# Patient Record
Sex: Male | Born: 1965 | Race: Asian | Hispanic: No | Smoking: Current every day smoker
Health system: Southern US, Community
[De-identification: ages and names within clinical notes are randomized; demographics above are authoritative.]

---

## 2021-12-19 ENCOUNTER — Other Ambulatory Visit: Payer: Self-pay

## 2021-12-19 ENCOUNTER — Encounter (HOSPITAL_BASED_OUTPATIENT_CLINIC_OR_DEPARTMENT_OTHER): Payer: Self-pay

## 2021-12-19 ENCOUNTER — Emergency Department (HOSPITAL_BASED_OUTPATIENT_CLINIC_OR_DEPARTMENT_OTHER)
Admission: EM | Admit: 2021-12-19 | Discharge: 2021-12-19 | Disposition: A | Discharge status: Home / Self Care | Payer: BC Managed Care – PPO | Attending: Emergency Medicine | Admitting: Emergency Medicine

## 2021-12-19 ENCOUNTER — Emergency Department (HOSPITAL_BASED_OUTPATIENT_CLINIC_OR_DEPARTMENT_OTHER): Payer: BC Managed Care – PPO

## 2021-12-19 DIAGNOSIS — Z20822 Contact with and (suspected) exposure to covid-19: Secondary | ICD-10-CM | POA: Diagnosis not present

## 2021-12-19 DIAGNOSIS — I10 Essential (primary) hypertension: Secondary | ICD-10-CM | POA: Insufficient documentation

## 2021-12-19 DIAGNOSIS — I160 Hypertensive urgency: Secondary | ICD-10-CM | POA: Diagnosis not present

## 2021-12-19 DIAGNOSIS — J209 Acute bronchitis, unspecified: Secondary | ICD-10-CM | POA: Diagnosis not present

## 2021-12-19 DIAGNOSIS — R059 Cough, unspecified: Secondary | ICD-10-CM | POA: Diagnosis present

## 2021-12-19 DIAGNOSIS — Z79899 Other long term (current) drug therapy: Secondary | ICD-10-CM | POA: Diagnosis not present

## 2021-12-19 LAB — RESP PANEL BY RT-PCR (FLU A&B, COVID) ARPGX2
Influenza A by PCR: NEGATIVE
Influenza B by PCR: NEGATIVE
SARS Coronavirus 2 by RT PCR: NEGATIVE

## 2021-12-19 MED ORDER — AMLODIPINE BESYLATE 5 MG PO TABS
5.0000 mg | ORAL_TABLET | Freq: Once | ORAL | Status: AC
  Administered 2021-12-19: 5 mg via ORAL
  Filled 2021-12-19: qty 1

## 2021-12-19 MED ORDER — ALBUTEROL SULFATE HFA 108 (90 BASE) MCG/ACT IN AERS
2.0000 | INHALATION_SPRAY | Freq: Once | RESPIRATORY_TRACT | Status: AC
  Administered 2021-12-19: 2 via RESPIRATORY_TRACT
  Filled 2021-12-19: qty 6.7

## 2021-12-19 MED ORDER — AMLODIPINE BESYLATE 5 MG PO TABS: 5.0000 mg | ORAL_TABLET | Freq: Every day | ORAL | 1 refills | Status: DC

## 2021-12-19 MED ORDER — PREDNISONE 50 MG PO TABS
60.0000 mg | ORAL_TABLET | Freq: Once | ORAL | Status: AC
  Administered 2021-12-19: 60 mg via ORAL
  Filled 2021-12-19: qty 1

## 2021-12-19 MED ORDER — ALBUTEROL SULFATE (2.5 MG/3ML) 0.083% IN NEBU
2.5000 mg | INHALATION_SOLUTION | Freq: Once | RESPIRATORY_TRACT | Status: AC
  Administered 2021-12-19: 2.5 mg via RESPIRATORY_TRACT
  Filled 2021-12-19: qty 3

## 2021-12-19 MED ORDER — ALBUTEROL SULFATE HFA 108 (90 BASE) MCG/ACT IN AERS: 1.0000 | INHALATION_SPRAY | RESPIRATORY_TRACT | 0 refills | Status: DC | PRN

## 2021-12-19 MED ORDER — PREDNISONE 20 MG PO TABS: 40.0000 mg | ORAL_TABLET | Freq: Every day | ORAL | 0 refills | Status: AC

## 2021-12-19 MED ORDER — IPRATROPIUM-ALBUTEROL 0.5-2.5 (3) MG/3ML IN SOLN
3.0000 mL | Freq: Once | RESPIRATORY_TRACT | Status: AC
  Administered 2021-12-19: 3 mL via RESPIRATORY_TRACT
  Filled 2021-12-19: qty 3

## 2021-12-19 NOTE — ED Triage Notes (Signed)
Pt c/o flu like sx x 1 week-audible wheezing noted when entered triage ?

## 2021-12-19 NOTE — ED Notes (Signed)
Ambulated patient per order. Resting HR 103/ Oxygen saturation 92%. Patient maintained a normal gait without difficulty. Patient oxygen saturation maintained at 92%. /HR 106. Patient returned to room and placed back on  monitor . Patient tolerated well. ?

## 2021-12-19 NOTE — Discharge Instructions (Addendum)
If you develop new or worsening shortness of breath, fever, cough, chest pain, or any other new/concerning symptoms then return to the ER for evaluation. ? ?Use the albuterol inhaler 1-2 puffs every 4 hours as needed for cough or shortness of breath.  If you needed significantly more than this then return to the ER for evaluation. ?

## 2021-12-19 NOTE — ED Notes (Signed)
ED Provider at bedside. 

## 2021-12-19 NOTE — ED Provider Notes (Signed)
?[facility] EMERGENCY DEPARTMENT ?Provider Note ? ? ?CSN: [id number] ?Arrival date & time: 12/19/21  1922 ? ?  ? ?History ? ?Chief Complaint  ?Patient presents with  ? Cough  ? ? ?Benjamin Decker is a 56 y.o. male. ? ?HPI ?56 year old male with no significant past medical history presents with cough and shortness of breath.  Started about 2 days ago.  No fevers.  He has a little bit of chest tightness but no chest pain.  No leg swelling.  He smokes about 2 or 3 cigarettes/day.  Denies any history of asthma or bronchitis. ? ?Home Medications ?Prior to Admission medications   ?Medication Sig Start Date End Date Taking? Authorizing Provider  ?albuterol (VENTOLIN HFA) 108 (90 Base) MCG/ACT inhaler Inhale 1-2 puffs into the lungs every 4 (four) hours as needed for wheezing or shortness of breath. 12/19/21  Yes Pricilla Loveless, MD  ?amLODipine (NORVASC) 5 MG tablet Take 1 tablet (5 mg total) by mouth daily. 12/19/21  Yes Pricilla Loveless, MD  ?predniSONE (DELTASONE) 20 MG tablet Take 2 tablets (40 mg total) by mouth daily for 4 days. 12/20/21 12/24/21 Yes Pricilla Loveless, MD  ?   ? ?Allergies    ?Patient has no known allergies.   ? ?Review of Systems   ?Review of Systems  ?Constitutional:  Negative for fever.  ?Respiratory:  Positive for cough, chest tightness and shortness of breath.   ?Cardiovascular:  Negative for chest pain and leg swelling.  ? ?Physical Exam ?Updated Vital Signs ?BP (!) 164/101 (BP Location: Left Arm)   Pulse (!) 104   Temp 98.4 ?F (36.9 ?C) (Oral)   Resp (!) 22   Ht 5\' 3"  (1.6 m)   Wt 66.2 kg   SpO2 94%   BMI 25.86 kg/m?  ?Physical Exam ?Vitals and nursing note reviewed.  ?Constitutional:   ?   Appearance: He is well-developed. He is not ill-appearing or diaphoretic.  ?HENT:  ?   Head: Normocephalic and atraumatic.  ?Cardiovascular:  ?   Rate and Rhythm: Normal rate and regular rhythm.  ?   Heart sounds: Normal heart sounds.  ?Pulmonary:  ?   Effort: Pulmonary effort is normal.  ?   Breath  sounds: Wheezing (diffuse, expiratory) present.  ?Abdominal:  ?   General: There is no distension.  ?Musculoskeletal:  ?   Right lower leg: No edema.  ?   Left lower leg: No edema.  ?Skin: ?   General: Skin is warm and dry.  ?Neurological:  ?   Mental Status: He is alert.  ? ? ?ED Results / Procedures / Treatments   ?Labs ?(all labs ordered are listed, but only abnormal results are displayed) ?Labs Reviewed  ?RESP PANEL BY RT-PCR (FLU A&B, COVID) ARPGX2  ? ? ?EKG ?EKG Interpretation ? ?Date/Time:  Monday December 19 2021 19:51:40 EDT ?Ventricular Rate:  69 ?PR Interval:  161 ?QRS Duration: 95 ?QT Interval:  404 ?QTC Calculation: 433 ?R Axis:   23 ?Text Interpretation: Sinus rhythm no acute ST/T changes No old tracing to compare Confirmed by [id number] [phone]) on 12/19/2021 8:03:51 PM ? ?Radiology ?DG Chest Portable 1 View ? ?Result Date: 12/19/2021 ?CLINICAL DATA:  Cough and wheezing for 1 week, initial encounter EXAM: PORTABLE CHEST 1 VIEW COMPARISON:  None. FINDINGS: The heart size and mediastinal contours are within normal limits. Both lungs are clear. The visualized skeletal structures are unremarkable. IMPRESSION: No active disease. Electronically Signed   By: 12/21/2021 M.D.   On: 12/19/2021  20:33   ? ?Procedures ?Procedures  ? ? ?Medications Ordered in ED ?Medications  ?ipratropium-albuterol (DUONEB) 0.5-2.5 (3) MG/3ML nebulizer solution 3 mL (3 mLs Nebulization Given 12/19/21 1952)  ?albuterol (PROVENTIL) (2.5 MG/3ML) 0.083% nebulizer solution 2.5 mg (2.5 mg Nebulization Given 12/19/21 1952)  ?predniSONE (DELTASONE) tablet 60 mg (60 mg Oral Given 12/19/21 2110)  ?amLODipine (NORVASC) tablet 5 mg (5 mg Oral Given 12/19/21 2110)  ?albuterol (PROVENTIL) (2.5 MG/3ML) 0.083% nebulizer solution 2.5 mg (2.5 mg Nebulization Given 12/19/21 2058)  ?albuterol (VENTOLIN HFA) 108 (90 Base) MCG/ACT inhaler 2 puff (2 puffs Inhalation Given 12/19/21 2200)  ? ? ?ED Course/ Medical Decision Making/ A&P ?  ?                         ?Medical Decision Making ?Problems Addressed: ?Acute bronchitis, unspecified organism: acute illness or injury that poses a threat to life or bodily functions ? ?Amount and/or Complexity of Data Reviewed ?External Data Reviewed: notes. ?Radiology: ordered and independent interpretation performed. ? ?Risk ?Prescription drug management. ? ? ?Patient appears to have acute bronchitis.  He is feeling better after his second round of albuterol/Atrovent.  He does not carry a COPD or asthma diagnosis, but he also was seen at an outside urgent care for wheezing a few months ago according to chart review.  He is not hypoxic.  He is feeling better and able to ambulate without desaturation.  He is also quite hypertensive and states he has not been on his antihypertensives for the last 3 months.  He is not sure what he was on so I gave him some Norvasc and will prescribe this.  Given concern he might have undiagnosed COPD in the setting of bronchitis, will give a steroid burst in addition to albuterol.  I do not think antibiotics are warranted given clear x-ray.  I personally reviewed/interpreted this image and there is no obvious pneumonia.  Will discharge home with return precautions. ? ? ? ? ? ? ? ?Final Clinical Impression(s) / ED Diagnoses ?Final diagnoses:  ?Acute bronchitis, unspecified organism  ?Hypertensive urgency  ? ? ?Rx / DC Orders ?ED Discharge Orders   ? ?      Ordered  ?  predniSONE (DELTASONE) 20 MG tablet  Daily       ? 12/19/21 2156  ?  albuterol (VENTOLIN HFA) 108 (90 Base) MCG/ACT inhaler  Every 4 hours PRN       ? 12/19/21 2156  ?  amLODipine (NORVASC) 5 MG tablet  Daily       ? 12/19/21 2156  ? ?  ?  ? ?  ? ? ?  ?Pricilla Loveless, MD ?12/19/21 2325 ? ?

## 2022-02-27 ENCOUNTER — Other Ambulatory Visit: Payer: Self-pay

## 2022-02-27 ENCOUNTER — Encounter (HOSPITAL_BASED_OUTPATIENT_CLINIC_OR_DEPARTMENT_OTHER): Payer: Self-pay | Admitting: Pediatrics

## 2022-02-27 ENCOUNTER — Emergency Department (HOSPITAL_BASED_OUTPATIENT_CLINIC_OR_DEPARTMENT_OTHER)
Admission: EM | Admit: 2022-02-27 | Discharge: 2022-02-27 | Disposition: A | Discharge status: Home / Self Care | Payer: BC Managed Care – PPO | Attending: Emergency Medicine | Admitting: Emergency Medicine

## 2022-02-27 ENCOUNTER — Emergency Department (HOSPITAL_BASED_OUTPATIENT_CLINIC_OR_DEPARTMENT_OTHER): Payer: BC Managed Care – PPO

## 2022-02-27 DIAGNOSIS — D72829 Elevated white blood cell count, unspecified: Secondary | ICD-10-CM | POA: Insufficient documentation

## 2022-02-27 DIAGNOSIS — Z79899 Other long term (current) drug therapy: Secondary | ICD-10-CM | POA: Insufficient documentation

## 2022-02-27 DIAGNOSIS — I10 Essential (primary) hypertension: Secondary | ICD-10-CM | POA: Diagnosis not present

## 2022-02-27 DIAGNOSIS — Z20822 Contact with and (suspected) exposure to covid-19: Secondary | ICD-10-CM | POA: Diagnosis not present

## 2022-02-27 DIAGNOSIS — R062 Wheezing: Secondary | ICD-10-CM | POA: Insufficient documentation

## 2022-02-27 LAB — CBC WITH DIFFERENTIAL/PLATELET
Abs Immature Granulocytes: 0.04 10*3/uL (ref 0.00–0.07)
Basophils Absolute: 0.1 10*3/uL (ref 0.0–0.1)
Basophils Relative: 1 %
Eosinophils Absolute: 0.5 10*3/uL (ref 0.0–0.5)
Eosinophils Relative: 4 %
HCT: 47.2 % (ref 39.0–52.0)
Hemoglobin: 16.1 g/dL (ref 13.0–17.0)
Immature Granulocytes: 0 %
Lymphocytes Relative: 12 %
Lymphs Abs: 1.5 10*3/uL (ref 0.7–4.0)
MCH: 31 pg (ref 26.0–34.0)
MCHC: 34.1 g/dL (ref 30.0–36.0)
MCV: 90.9 fL (ref 80.0–100.0)
Monocytes Absolute: 0.9 10*3/uL (ref 0.1–1.0)
Monocytes Relative: 7 %
Neutro Abs: 9.8 10*3/uL — ABNORMAL HIGH (ref 1.7–7.7)
Neutrophils Relative %: 76 %
Platelets: 206 10*3/uL (ref 150–400)
RBC: 5.19 MIL/uL (ref 4.22–5.81)
RDW: 12.9 % (ref 11.5–15.5)
WBC: 12.9 10*3/uL — ABNORMAL HIGH (ref 4.0–10.5)
nRBC: 0 % (ref 0.0–0.2)

## 2022-02-27 LAB — BASIC METABOLIC PANEL
Anion gap: 9 (ref 5–15)
BUN: 16 mg/dL (ref 6–20)
CO2: 24 mmol/L (ref 22–32)
Calcium: 9.1 mg/dL (ref 8.9–10.3)
Chloride: 105 mmol/L (ref 98–111)
Creatinine, Ser: 1.22 mg/dL (ref 0.61–1.24)
GFR, Estimated: >60 mL/min (ref >60)
Glucose, Bld: 95 mg/dL (ref 70–99)
Potassium: 4.2 mmol/L (ref 3.5–5.1)
Sodium: 138 mmol/L (ref 135–145)

## 2022-02-27 LAB — TROPONIN I (HIGH SENSITIVITY): Troponin I (High Sensitivity): 5 ng/L (ref <18)

## 2022-02-27 LAB — RESP PANEL BY RT-PCR (FLU A&B, COVID) ARPGX2
Influenza A by PCR: NEGATIVE
Influenza B by PCR: NEGATIVE
SARS Coronavirus 2 by RT PCR: NEGATIVE

## 2022-02-27 MED ORDER — AMLODIPINE BESYLATE 5 MG PO TABS: 5.0000 mg | ORAL_TABLET | Freq: Every day | ORAL | 1 refills | Status: AC

## 2022-02-27 MED ORDER — ALBUTEROL SULFATE HFA 108 (90 BASE) MCG/ACT IN AERS: 2.0000 | INHALATION_SPRAY | RESPIRATORY_TRACT | Status: DC | PRN

## 2022-02-27 MED ORDER — IPRATROPIUM-ALBUTEROL 0.5-2.5 (3) MG/3ML IN SOLN
RESPIRATORY_TRACT | Status: AC
  Administered 2022-02-27: 3 mL
  Filled 2022-02-27: qty 3

## 2022-02-27 MED ORDER — HYDRALAZINE HCL 20 MG/ML IJ SOLN
25.0000 mg | Freq: Once | INTRAMUSCULAR | Status: AC
  Administered 2022-02-27: 25 mg via INTRAVENOUS
  Filled 2022-02-27: qty 2

## 2022-02-27 MED ORDER — ALBUTEROL (5 MG/ML) CONTINUOUS INHALATION SOLN
INHALATION_SOLUTION | RESPIRATORY_TRACT | Status: AC
  Administered 2022-02-27: 2.5 mg
  Filled 2022-02-27: qty 0.5

## 2022-02-27 MED ORDER — ALBUTEROL SULFATE HFA 108 (90 BASE) MCG/ACT IN AERS: 1.0000 | INHALATION_SPRAY | RESPIRATORY_TRACT | 2 refills | Status: DC | PRN

## 2022-02-27 MED ORDER — METHYLPREDNISOLONE SODIUM SUCC 125 MG IJ SOLR
125.0000 mg | Freq: Once | INTRAMUSCULAR | Status: AC
  Administered 2022-02-27: 125 mg via INTRAVENOUS
  Filled 2022-02-27: qty 2

## 2022-02-27 NOTE — ED Provider Notes (Signed)
[facility] EMERGENCY DEPARTMENT Provider Note   CSN: [id number] Arrival date & time: 02/27/22  1726     History  Chief Complaint  Patient presents with   Wheezing    Benjamin Decker is a 56 y.o. male with past medical history significant for hypertension, previous evaluation for acute bronchitis who presents with difficulty breathing without chest pain from last night.  Patient denies any previous history of asthma, COPD.  Denies any sick contacts, fever, chills.  He reports that his blood pressure has been under poor control since he ran out of the amlodipine medication that he was started on last time he was in the emergency department.  He denies any previous history of ACS, chest pain, heart palpitations, nausea, vomiting, dysuria, abdominal pain.  He reports his only significant symptom at this time is difficulty breathing.  No previous history of heart failure.   Wheezing     Home Medications Prior to Admission medications   Medication Sig Start Date End Date Taking? Authorizing Provider  albuterol (VENTOLIN HFA) 108 (90 Base) MCG/ACT inhaler Inhale 1-2 puffs into the lungs every 4 (four) hours as needed for wheezing or shortness of breath. 02/27/22   Joclyn Alsobrook H, PA-C  amLODipine (NORVASC) 5 MG tablet Take 1 tablet (5 mg total) by mouth daily. 02/27/22   Gianfranco Araki H, PA-C      Allergies    Patient has no known allergies.    Review of Systems   Review of Systems  Respiratory:  Positive for wheezing.   All other systems reviewed and are negative.  Physical Exam Updated Vital Signs BP (!) 156/120   Pulse (!) 102   Temp 98.2 F (36.8 C)   Resp 20   Ht 5\' 3"  (1.6 m)   Wt 66.7 kg   SpO2 93%   BMI 26.04 kg/m  Physical Exam Vitals and nursing note reviewed.  Constitutional:      General: He is not in acute distress.    Appearance: Normal appearance.  HENT:     Head: Normocephalic and atraumatic.  Eyes:     General:        Right eye:  No discharge.        Left eye: No discharge.  Cardiovascular:     Rate and Rhythm: Normal rate and regular rhythm.     Heart sounds: No murmur heard.   No friction rub. No gallop.  Pulmonary:     Effort: Pulmonary effort is normal.     Breath sounds: Normal breath sounds.     Comments: Patient with mild tachypnea.  He does have biphasic wheezing throughout both lung fields even after albuterol treatment x1.  He is not in acute respiratory distress however at this time.  No accessory muscle use, intercostal retractions. Abdominal:     General: Bowel sounds are normal.     Palpations: Abdomen is soft.  Skin:    General: Skin is warm and dry.     Capillary Refill: Capillary refill takes less than 2 seconds.     Comments: Patient has some rash, excoriations on the abdomen without signs of secondary infection  Neurological:     Mental Status: He is alert and oriented to person, place, and time.  Psychiatric:        Mood and Affect: Mood normal.        Behavior: Behavior normal.    ED Results / Procedures / Treatments   Labs (all labs ordered are listed, but only  abnormal results are displayed) Labs Reviewed  CBC WITH DIFFERENTIAL/PLATELET - Abnormal; Notable for the following components:      Result Value   WBC 12.9 (*)    Neutro Abs 9.8 (*)    All other components within normal limits  RESP PANEL BY RT-PCR (FLU A&B, COVID) ARPGX2  BASIC METABOLIC PANEL  TROPONIN I (HIGH SENSITIVITY)    EKG EKG Interpretation  Date/Time:  Monday February 27 2022 17:42:28 EDT Ventricular Rate:  69 PR Interval:  158 QRS Duration: 97 QT Interval:  419 QTC Calculation: 449 R Axis:   14 Text Interpretation: Sinus rhythm Probable left atrial enlargement Abnormal inferior Q waves Anterior infarct, old Confirmed by Alona Bene [phone]) on 02/27/2022 5:44:39 PM  Radiology DG Chest 2 View  Result Date: 02/27/2022 CLINICAL DATA:  Shortness of breath EXAM: CHEST - 2 VIEW COMPARISON:  12/19/2021 FINDINGS:  Transverse diameter of heart is increased. Thoracic aorta is tortuous and ectatic. Lung fields are clear of any infiltrates or pulmonary edema. There is no pleural effusion or pneumothorax. IMPRESSION: Cardiomegaly. There are no signs of pulmonary edema or focal pulmonary consolidation. Electronically Signed   By: Ernie Avena M.D.   On: 02/27/2022 18:19    Procedures Procedures    Medications Ordered in ED Medications  albuterol (VENTOLIN HFA) 108 (90 Base) MCG/ACT inhaler 2 puff (has no administration in time range)  ipratropium-albuterol (DUONEB) 0.5-2.5 (3) MG/3ML nebulizer solution (3 mLs  Given 02/27/22 1740)  albuterol (VENTOLIN) (5 MG/ML) 0.5% continuous inhalation solution (2.5 mg  Given 02/27/22 1740)  methylPREDNISolone sodium succinate (SOLU-MEDROL) 125 mg/2 mL injection 125 mg (125 mg Intravenous Given 02/27/22 1756)  hydrALAZINE (APRESOLINE) injection 25 mg (25 mg Intravenous Given 02/27/22 1756)    ED Course/ Medical Decision Making/ A&P                           Medical Decision Making Amount and/or Complexity of Data Reviewed Labs: ordered. Radiology: ordered.  Risk Prescription drug management.   This patient is a 56 y.o. male who presents to the ED for concern of shortness of breath, wheezing, uncontrolled hypertension, this involves an extensive number of treatment options, and is a complaint that carries with it a high risk of complications and morbidity. The emergent differential diagnosis prior to evaluation includes, but is not limited to,  asthma exacerbation, COPD exacerbation, heart failure exacerbation, pneumonia, acute bronchitis, or other respiratory illness versus PE versus other.   This is not an exhaustive differential.   Past Medical History / Co-morbidities / Social History: Patient without PCP care or noted diagnoses, he does have a history of hypertension on previous evaluation  Additional history: Chart reviewed. Pertinent results include:  Reviewed lab work, imaging, and course from recent evaluation for acute bronchitis, hypertension in March  Physical Exam: Physical exam performed. The pertinent findings include: Patient with diffuse biphasic wheezing without respiratory distress.  Lab Tests: I ordered, and personally interpreted labs.  The pertinent results include: Negative troponin x1 in context of no chest pain. Troponin ordered due to hypertension with systolic over 200 for possible hypertensive emergency.  RVP negative for COVID, flu.  BMP unremarkable.  CBC with a mild leukocytosis white blood cells of 12.9.     Imaging Studies: I ordered imaging studies including cxr. I independently visualized and interpreted imaging which showed Cardiomegaly without signs of pulmonary congestion, or focal consolidation. I agree with the radiologist interpretation.   Cardiac Monitoring:  The  patient was maintained on a cardiac monitor.  My attending physician Dr. Jacqulyn BathLong viewed and interpreted the cardiac monitored which showed an underlying rhythm of: Normal sinus rhythm,  evidence of left atrial enlargement, evidence of possible ischemic activity in the past but no acute ischemic changes today. I agree with this interpretation.   Medications: I ordered medication including rousing for blood pressure, Solu-Medrol for Reactive airway disease, albuterol/DuoNeb for, respiratory distress without hypoxia. Reevaluation of the patient after these medicines showed that the patient resolved. I have reviewed the patients home medicines and have made adjustments as needed.  Patient with significant improvement of blood pressure as well as complete resolution of wheezing prior to discharge.  We will  refill his hypertension medication as well as inhaler.  Discussed the importance of PCP follow-up to get on a long-term prescription of these medications.   Disposition: After consideration of the diagnostic results and the patients response to treatment, I  feel that it appears stable for discharge at this time.  Extensive return precautions given  I discussed this case with my attending physician Dr. Jacqulyn BathLong who cosigned this note including patient's presenting symptoms, physical exam, and planned diagnostics and interventions. Attending physician stated agreement with plan or made changes to plan which were implemented.    Final Clinical Impression(s) / ED Diagnoses Final diagnoses:  Wheezing  Hypertension, unspecified type    Rx / DC Orders ED Discharge Orders          Ordered    albuterol (VENTOLIN HFA) 108 (90 Base) MCG/ACT inhaler  Every 4 hours PRN        02/27/22 2014    amLODipine (NORVASC) 5 MG tablet  Daily        02/27/22 2014              West Balirosperi, Alani Lacivita H, PA-C 02/27/22 2018    Maia PlanLong, Joshua G, MD 02/28/22 1323

## 2022-02-27 NOTE — ED Triage Notes (Signed)
C/O difficulty breathing since last night

## 2022-02-27 NOTE — Discharge Instructions (Addendum)
Your symptoms today are consistent with a reactive airway disease/COPD or asthma exacerbation, however you do not have a previous known diagnosis of this.  Your breathing is significantly improved on reevaluation.  I am represcribing your blood pressure medicine as well as your inhaler.  It is crucial that you follow-up with your primary care doctor so that you can get on blood pressure medicine and prevent damage to your heart.  Please return to the emergency department if you begin to have worsening shortness of breath, chest pain, fever, chills or other concerns.

## 2022-04-09 ENCOUNTER — Encounter (HOSPITAL_BASED_OUTPATIENT_CLINIC_OR_DEPARTMENT_OTHER): Payer: Self-pay | Admitting: Emergency Medicine

## 2022-04-09 ENCOUNTER — Emergency Department (HOSPITAL_BASED_OUTPATIENT_CLINIC_OR_DEPARTMENT_OTHER): Payer: BC Managed Care – PPO

## 2022-04-09 ENCOUNTER — Emergency Department (HOSPITAL_BASED_OUTPATIENT_CLINIC_OR_DEPARTMENT_OTHER)
Admission: EM | Admit: 2022-04-09 | Discharge: 2022-04-10 | Disposition: A | Discharge status: Home / Self Care | Payer: BC Managed Care – PPO | Attending: Emergency Medicine | Admitting: Emergency Medicine

## 2022-04-09 DIAGNOSIS — F17200 Nicotine dependence, unspecified, uncomplicated: Secondary | ICD-10-CM | POA: Diagnosis not present

## 2022-04-09 DIAGNOSIS — Z79899 Other long term (current) drug therapy: Secondary | ICD-10-CM | POA: Insufficient documentation

## 2022-04-09 DIAGNOSIS — J441 Chronic obstructive pulmonary disease with (acute) exacerbation: Secondary | ICD-10-CM | POA: Diagnosis not present

## 2022-04-09 DIAGNOSIS — R0602 Shortness of breath: Secondary | ICD-10-CM | POA: Diagnosis present

## 2022-04-09 DIAGNOSIS — I10 Essential (primary) hypertension: Secondary | ICD-10-CM | POA: Diagnosis not present

## 2022-04-09 LAB — CBC WITH DIFFERENTIAL/PLATELET
Abs Immature Granulocytes: 0.02 10*3/uL (ref 0.00–0.07)
Basophils Absolute: 0.1 10*3/uL (ref 0.0–0.1)
Basophils Relative: 1 %
Eosinophils Absolute: 0.7 10*3/uL — ABNORMAL HIGH (ref 0.0–0.5)
Eosinophils Relative: 8 %
HCT: 45.7 % (ref 39.0–52.0)
Hemoglobin: 15.6 g/dL (ref 13.0–17.0)
Immature Granulocytes: 0 %
Lymphocytes Relative: 27 %
Lymphs Abs: 2.3 10*3/uL (ref 0.7–4.0)
MCH: 31 pg (ref 26.0–34.0)
MCHC: 34.1 g/dL (ref 30.0–36.0)
MCV: 90.9 fL (ref 80.0–100.0)
Monocytes Absolute: 0.7 10*3/uL (ref 0.1–1.0)
Monocytes Relative: 8 %
Neutro Abs: 4.9 10*3/uL (ref 1.7–7.7)
Neutrophils Relative %: 56 %
Platelets: 198 10*3/uL (ref 150–400)
RBC: 5.03 MIL/uL (ref 4.22–5.81)
RDW: 12.6 % (ref 11.5–15.5)
WBC: 8.7 10*3/uL (ref 4.0–10.5)
nRBC: 0 % (ref 0.0–0.2)

## 2022-04-09 LAB — BASIC METABOLIC PANEL
Anion gap: 7 (ref 5–15)
BUN: 27 mg/dL — ABNORMAL HIGH (ref 6–20)
CO2: 29 mmol/L (ref 22–32)
Calcium: 9.6 mg/dL (ref 8.9–10.3)
Chloride: 104 mmol/L (ref 98–111)
Creatinine, Ser: 1.32 mg/dL — ABNORMAL HIGH (ref 0.61–1.24)
GFR, Estimated: >60 mL/min (ref >60)
Glucose, Bld: 111 mg/dL — ABNORMAL HIGH (ref 70–99)
Potassium: 3.7 mmol/L (ref 3.5–5.1)
Sodium: 140 mmol/L (ref 135–145)

## 2022-04-09 LAB — BRAIN NATRIURETIC PEPTIDE: B Natriuretic Peptide: 21.1 pg/mL (ref 0.0–100.0)

## 2022-04-09 MED ORDER — ALBUTEROL SULFATE (2.5 MG/3ML) 0.083% IN NEBU
5.0000 mg | INHALATION_SOLUTION | Freq: Once | RESPIRATORY_TRACT | Status: AC
  Administered 2022-04-09: 5 mg via RESPIRATORY_TRACT
  Filled 2022-04-09: qty 6

## 2022-04-09 MED ORDER — PREDNISONE 10 MG PO TABS: 40.0000 mg | ORAL_TABLET | Freq: Every day | ORAL | 0 refills | Status: DC

## 2022-04-09 MED ORDER — METHYLPREDNISOLONE SODIUM SUCC 125 MG IJ SOLR
125.0000 mg | Freq: Once | INTRAMUSCULAR | Status: AC
  Administered 2022-04-09: 125 mg via INTRAVENOUS
  Filled 2022-04-09: qty 2

## 2022-04-09 MED ORDER — ALBUTEROL SULFATE (2.5 MG/3ML) 0.083% IN NEBU
2.5000 mg | INHALATION_SOLUTION | Freq: Once | RESPIRATORY_TRACT | Status: AC
  Administered 2022-04-09: 2.5 mg via RESPIRATORY_TRACT
  Filled 2022-04-09: qty 3

## 2022-04-09 MED ORDER — AMLODIPINE BESYLATE 5 MG PO TABS: 5.0000 mg | ORAL_TABLET | Freq: Every day | ORAL | 3 refills | Status: DC

## 2022-04-09 MED ORDER — ALBUTEROL SULFATE HFA 108 (90 BASE) MCG/ACT IN AERS
2.0000 | INHALATION_SPRAY | Freq: Four times a day (QID) | RESPIRATORY_TRACT | Status: DC | PRN
  Administered 2022-04-09: 2 via RESPIRATORY_TRACT
  Filled 2022-04-09: qty 6.7

## 2022-04-09 MED ORDER — IPRATROPIUM-ALBUTEROL 0.5-2.5 (3) MG/3ML IN SOLN
3.0000 mL | Freq: Once | RESPIRATORY_TRACT | Status: AC
  Administered 2022-04-09: 3 mL via RESPIRATORY_TRACT
  Filled 2022-04-09: qty 3

## 2022-04-09 NOTE — ED Provider Notes (Addendum)
[facility] EMERGENCY DEPARTMENT Provider Note   CSN: [id number] Arrival date & time: 04/09/22  2042     History  Chief Complaint  Patient presents with   Shortness of Breath    Benjamin Decker is a 56 y.o. male.  Patient presenting with significant wheezing and trouble breathing.  Oxygen sats were 85% on room air in triage.  Patient started on oxygen.  Patient given DuoNebs and albuterol nebulizer treatments and Solu-Medrol IV.  Patient denies past medical history.  The patient was seen in March of this year and June 5 of this year at that time was treated with albuterol and Norvasc for his blood pressure.  Known to have a history of hypertension.  And most likely does have COPD because he still smoker.  But they have called him asthma or reactive airway disease.  Patient does not have a primary care doctor.       Home Medications Prior to Admission medications   Medication Sig Start Date End Date Taking? Authorizing Provider  amLODipine (NORVASC) 5 MG tablet Take 1 tablet (5 mg total) by mouth daily. 04/09/22  Yes Vanetta Mulders, MD  predniSONE (DELTASONE) 10 MG tablet Take 4 tablets (40 mg total) by mouth daily. 04/09/22  Yes Vanetta Mulders, MD  albuterol (VENTOLIN HFA) 108 (90 Base) MCG/ACT inhaler Inhale 1-2 puffs into the lungs every 4 (four) hours as needed for wheezing or shortness of breath. 02/27/22   Prosperi, Christian H, PA-C  amLODipine (NORVASC) 5 MG tablet Take 1 tablet (5 mg total) by mouth daily. 02/27/22   Prosperi, Christian H, PA-C      Allergies    Patient has no known allergies.    Review of Systems   Review of Systems  Constitutional:  Negative for chills and fever.  HENT:  Negative for ear pain and sore throat.   Eyes:  Negative for pain and visual disturbance.  Respiratory:  Positive for shortness of breath and wheezing. Negative for cough.   Cardiovascular:  Negative for chest pain, palpitations and leg swelling.  Gastrointestinal:   Negative for abdominal pain and vomiting.  Genitourinary:  Negative for dysuria and hematuria.  Musculoskeletal:  Negative for arthralgias and back pain.  Skin:  Negative for color change and rash.  Neurological:  Negative for seizures and syncope.  All other systems reviewed and are negative.   Physical Exam Updated Vital Signs BP (!) 157/105   Pulse (!) 106   Temp 98.1 F (36.7 C) (Oral)   Resp (!) 22   Ht 1.6 m (5\' 3" )   Wt 68.9 kg   SpO2 97%   BMI 26.93 kg/m  Physical Exam Vitals and nursing note reviewed.  Constitutional:      General: He is not in acute distress.    Appearance: Normal appearance. He is well-developed.  HENT:     Head: Normocephalic and atraumatic.  Eyes:     Conjunctiva/sclera: Conjunctivae normal.     Pupils: Pupils are equal, round, and reactive to light.  Cardiovascular:     Rate and Rhythm: Normal rate and regular rhythm.     Heart sounds: No murmur heard. Pulmonary:     Effort: Respiratory distress present.     Breath sounds: Wheezing present.  Abdominal:     Palpations: Abdomen is soft.     Tenderness: There is no abdominal tenderness.  Musculoskeletal:        General: No swelling.     Cervical back: Neck supple.  Right lower leg: No edema.     Left lower leg: No edema.  Skin:    General: Skin is warm and dry.     Capillary Refill: Capillary refill takes less than 2 seconds.  Neurological:     General: No focal deficit present.     Mental Status: He is alert and oriented to person, place, and time.     Cranial Nerves: No cranial nerve deficit.     Sensory: No sensory deficit.  Psychiatric:        Mood and Affect: Mood normal.     ED Results / Procedures / Treatments   Labs (all labs ordered are listed, but only abnormal results are displayed) Labs Reviewed  BASIC METABOLIC PANEL - Abnormal; Notable for the following components:      Result Value   Glucose, Bld 111 (*)    BUN 27 (*)    Creatinine, Ser 1.32 (*)    All  other components within normal limits  CBC WITH DIFFERENTIAL/PLATELET  BRAIN NATRIURETIC PEPTIDE    EKG EKG Interpretation  Date/Time:  Sunday April 09 2022 21:28:20 EDT Ventricular Rate:  83 PR Interval:  158 QRS Duration: 92 QT Interval:  376 QTC Calculation: 441 R Axis:   -7 Text Interpretation: Normal sinus rhythm Inferior infarct , age undetermined Anterior infarct , age undetermined Abnormal ECG When compared with ECG of 27-Feb-2022 17:42, PREVIOUS ECG IS PRESENT No significant change since last tracing Confirmed by Vanetta Mulders [phone]) on 04/09/2022 10:35:21 PM  Radiology DG Chest 2 View  Result Date: 04/09/2022 CLINICAL DATA:  Shortness of breath and cough. EXAM: CHEST - 2 VIEW COMPARISON:  Chest radiograph dated 02/27/2022. FINDINGS: No focal consolidation, pleural effusion or pneumothorax. The cardiac silhouette is within limits. No acute osseous pathology. IMPRESSION: No active cardiopulmonary disease. Electronically Signed   By: Elgie Collard M.D.   On: 04/09/2022 22:28    Procedures Procedures    Medications Ordered in ED Medications  albuterol (PROVENTIL) (2.5 MG/3ML) 0.083% nebulizer solution 2.5 mg (has no administration in time range)  albuterol (VENTOLIN HFA) 108 (90 Base) MCG/ACT inhaler 2 puff (has no administration in time range)  ipratropium-albuterol (DUONEB) 0.5-2.5 (3) MG/3ML nebulizer solution 3 mL (3 mLs Nebulization Given 04/09/22 2140)  albuterol (PROVENTIL) (2.5 MG/3ML) 0.083% nebulizer solution 2.5 mg (2.5 mg Nebulization Given 04/09/22 2140)  albuterol (PROVENTIL) (2.5 MG/3ML) 0.083% nebulizer solution 5 mg (5 mg Nebulization Given 04/09/22 2140)  methylPREDNISolone sodium succinate (SOLU-MEDROL) 125 mg/2 mL injection 125 mg (125 mg Intravenous Given 04/09/22 2215)    ED Course/ Medical Decision Making/ A&P                           Medical Decision Making Amount and/or Complexity of Data Reviewed Labs: ordered.  Risk Prescription drug  management.   Patient treated with albuterol and DuoNeb.  Wheezing resolved but not moving air well.  Oxygen sats with oxygen turned off now going down to 92%.  We will continue to follow him closely.  And will go ahead and give him a third treatment of albuterol.  He did receive Solu-Medrol.  Patient states he is out of his Norvasc and out of his albuterol inhaler which is probably contributing to his problem.  He is patient still smokes.  Patient's BNP is normal.  CBC no leukocytosis hemoglobin is good basic metabolic panel creatinine is 1.32 GFR is greater than 60.  Patient will be observed and rechecked  by the overnight provider Dr. Thereasa Solo.  If patient drops his sats or gets bronchospasm again he will need admission.   Final Clinical Impression(s) / ED Diagnoses Final diagnoses:  COPD exacerbation (HCC)  Primary hypertension    Rx / DC Orders ED Discharge Orders          Ordered    amLODipine (NORVASC) 5 MG tablet  Daily        04/09/22 2245    predniSONE (DELTASONE) 10 MG tablet  Daily        04/09/22 2245              Vanetta Mulders, MD 04/09/22 2250    Vanetta Mulders, MD 04/09/22 2310    Vanetta Mulders, MD 04/09/22 2311

## 2022-04-09 NOTE — Discharge Instructions (Addendum)
Use the albuterol inhaler 2 puffs every 6 hours for the next 7 days and then as needed.  Take the prednisone as directed for the next 5 days.  Make an appointment to follow-up with the wellness clinic.  Have renewed your blood pressure medicine take that daily.  Return for any new or worse symptoms.

## 2022-04-09 NOTE — ED Notes (Signed)
RN Gaspar Garbe and Resp Ginny @bedside .  Attempt transport to imaging again in about 15 min.

## 2022-04-09 NOTE — ED Triage Notes (Signed)
Pt SOB, cough and rash since last night not getting better so came for evaluation. O2 85% on room air in triage.

## 2022-04-10 NOTE — ED Provider Notes (Signed)
  Physical Exam  BP (!) 128/94   Pulse (!) 109   Temp 98.1 F (36.7 C) (Oral)   Resp (!) 21   Ht 5\' 3"  (1.6 m)   Wt 68.9 kg   SpO2 93%   BMI 26.93 kg/m   Physical Exam Vitals and nursing note reviewed.  Constitutional:      General: He is not in acute distress.    Appearance: He is well-developed. He is not diaphoretic.  HENT:     Head: Normocephalic and atraumatic.  Cardiovascular:     Rate and Rhythm: Normal rate and regular rhythm.     Heart sounds: No murmur heard.    No friction rub.  Pulmonary:     Effort: Pulmonary effort is normal. No respiratory distress.     Breath sounds: Normal breath sounds. No wheezing or rales.  Abdominal:     General: Bowel sounds are normal. There is no distension.     Palpations: Abdomen is soft.     Tenderness: There is no abdominal tenderness.  Musculoskeletal:        General: Normal range of motion.     Cervical back: Normal range of motion and neck supple.  Skin:    General: Skin is warm and dry.  Neurological:     Mental Status: He is alert and oriented to person, place, and time.     Coordination: Coordination normal.     Procedures  Procedures  ED Course / MDM  Care assumed from Dr. at shift change.  Patient awaiting reassessment for response to breathing treatments.  Patient has resumed several neb treatments and now seems to be feeling better.  Oxygen saturations are in the low to mid 90s.  Patient in no respiratory distress.  I feel as though he can safely be discharged with an albuterol inhaler, prednisone, and return as needed.       Deretha Emory, MD 04/10/22 0030

## 2022-04-30 ENCOUNTER — Other Ambulatory Visit: Payer: Self-pay

## 2022-04-30 ENCOUNTER — Encounter (HOSPITAL_BASED_OUTPATIENT_CLINIC_OR_DEPARTMENT_OTHER): Payer: Self-pay | Admitting: Emergency Medicine

## 2022-04-30 ENCOUNTER — Emergency Department (HOSPITAL_BASED_OUTPATIENT_CLINIC_OR_DEPARTMENT_OTHER): Payer: BC Managed Care – PPO

## 2022-04-30 ENCOUNTER — Emergency Department (HOSPITAL_BASED_OUTPATIENT_CLINIC_OR_DEPARTMENT_OTHER)
Admission: EM | Admit: 2022-04-30 | Discharge: 2022-04-30 | Disposition: A | Discharge status: Home / Self Care | Payer: BC Managed Care – PPO | Attending: Emergency Medicine | Admitting: Emergency Medicine

## 2022-04-30 DIAGNOSIS — I10 Essential (primary) hypertension: Secondary | ICD-10-CM | POA: Insufficient documentation

## 2022-04-30 DIAGNOSIS — Z79899 Other long term (current) drug therapy: Secondary | ICD-10-CM | POA: Diagnosis not present

## 2022-04-30 DIAGNOSIS — R062 Wheezing: Secondary | ICD-10-CM | POA: Insufficient documentation

## 2022-04-30 DIAGNOSIS — R059 Cough, unspecified: Secondary | ICD-10-CM | POA: Diagnosis not present

## 2022-04-30 DIAGNOSIS — R0602 Shortness of breath: Secondary | ICD-10-CM | POA: Diagnosis present

## 2022-04-30 MED ORDER — METHYLPREDNISOLONE SODIUM SUCC 125 MG IJ SOLR
125.0000 mg | Freq: Once | INTRAMUSCULAR | Status: AC
Start: 2022-04-30 — End: 2022-04-30
  Administered 2022-04-30: 125 mg via INTRAVENOUS
  Filled 2022-04-30: qty 2

## 2022-04-30 MED ORDER — ALBUTEROL SULFATE (2.5 MG/3ML) 0.083% IN NEBU
2.5000 mg | INHALATION_SOLUTION | Freq: Once | RESPIRATORY_TRACT | Status: AC
  Administered 2022-04-30: 2.5 mg via RESPIRATORY_TRACT

## 2022-04-30 MED ORDER — ALBUTEROL SULFATE (2.5 MG/3ML) 0.083% IN NEBU
INHALATION_SOLUTION | RESPIRATORY_TRACT | Status: AC
  Filled 2022-04-30: qty 3

## 2022-04-30 MED ORDER — IPRATROPIUM-ALBUTEROL 0.5-2.5 (3) MG/3ML IN SOLN
3.0000 mL | Freq: Once | RESPIRATORY_TRACT | Status: AC
  Administered 2022-04-30: 3 mL via RESPIRATORY_TRACT

## 2022-04-30 MED ORDER — IPRATROPIUM-ALBUTEROL 0.5-2.5 (3) MG/3ML IN SOLN
RESPIRATORY_TRACT | Status: AC
  Filled 2022-04-30: qty 3

## 2022-04-30 MED ORDER — ALBUTEROL SULFATE HFA 108 (90 BASE) MCG/ACT IN AERS: 1.0000 | INHALATION_SPRAY | RESPIRATORY_TRACT | 2 refills | Status: DC | PRN

## 2022-04-30 MED ORDER — ALBUTEROL SULFATE (2.5 MG/3ML) 0.083% IN NEBU
10.0000 mg/h | INHALATION_SOLUTION | RESPIRATORY_TRACT | Status: DC
  Administered 2022-04-30: 10 mg/h via RESPIRATORY_TRACT
  Filled 2022-04-30: qty 3

## 2022-04-30 NOTE — ED Provider Notes (Signed)
[facility] EMERGENCY DEPARTMENT Provider Note   CSN: [id number] Arrival date & time: 04/30/22  1836     History  Chief Complaint  Patient presents with   Shortness of Breath    Benjamin Decker is a 56 y.o. male who denies any past medical history.  Presents to the emergency department complaint of shortness of breath.  Patient states that shortness of breath started today at approximately noon after returning from work.  Shortness of breath has been constant since then.  Patient reports that he uses albuterol inhaler at home with improvement however ran out of this medication.  Patient states that he started having a nonproductive cough today.  Patient denies any known sick contacts.  Patient has not had any symptoms prior to today.  Patient denies any fever, chills, chest pain, palpitations, leg swelling tenderness, hemoptysis, nausea, vomiting, diarrhea.        Shortness of Breath Associated symptoms: cough   Associated symptoms: no abdominal pain, no chest pain, no fever, no headaches, no neck pain, no rash and no vomiting        Home Medications Prior to Admission medications   Medication Sig Start Date End Date Taking? Authorizing Provider  albuterol (VENTOLIN HFA) 108 (90 Base) MCG/ACT inhaler Inhale 1-2 puffs into the lungs every 4 (four) hours as needed for wheezing or shortness of breath. 02/27/22   Prosperi, Christian H, PA-C  amLODipine (NORVASC) 5 MG tablet Take 1 tablet (5 mg total) by mouth daily. 02/27/22   Prosperi, Christian H, PA-C  amLODipine (NORVASC) 5 MG tablet Take 1 tablet (5 mg total) by mouth daily. 04/09/22   Vanetta Mulders, MD  predniSONE (DELTASONE) 10 MG tablet Take 4 tablets (40 mg total) by mouth daily. 04/09/22   Vanetta Mulders, MD      Allergies    Patient has no known allergies.    Review of Systems   Review of Systems  Constitutional:  Negative for chills and fever.  Eyes:  Negative for visual disturbance.  Respiratory:   Positive for cough and shortness of breath.   Cardiovascular:  Negative for chest pain.  Gastrointestinal:  Negative for abdominal pain, nausea and vomiting.  Musculoskeletal:  Negative for back pain and neck pain.  Skin:  Negative for color change and rash.  Neurological:  Negative for dizziness, syncope, light-headedness and headaches.  Psychiatric/Behavioral:  Negative for confusion.     Physical Exam Updated Vital Signs BP (!) 186/106   Pulse 80   Temp 97.9 F (36.6 C) (Oral)   Resp (!) 25   SpO2 (!) 89%  Physical Exam Vitals and nursing note reviewed.  Constitutional:      General: He is not in acute distress.    Appearance: He is not ill-appearing, toxic-appearing or diaphoretic.  HENT:     Head: Normocephalic.  Eyes:     General: No scleral icterus.       Right eye: No discharge.        Left eye: No discharge.  Cardiovascular:     Rate and Rhythm: Normal rate.     Pulses:          Radial pulses are 2+ on the right side and 2+ on the left side.  Pulmonary:     Effort: Pulmonary effort is normal. Tachypnea present.     Breath sounds: Wheezing present.     Comments: Decreased breath sounds throughout with expiratory expiratory wheezing. Musculoskeletal:     Right lower leg: No edema.  Left lower leg: No edema.     Comments: No swelling or tenderness to bilateral lower extremities bilaterally.  Skin:    General: Skin is warm and dry.  Neurological:     General: No focal deficit present.     Mental Status: He is alert.  Psychiatric:        Behavior: Behavior is cooperative.     ED Results / Procedures / Treatments   Labs (all labs ordered are listed, but only abnormal results are displayed) Labs Reviewed - No data to display  EKG None  Radiology DG Chest [facility] 1 View  Result Date: 04/30/2022 CLINICAL DATA:  Shortness of breath and cough today. EXAM: PORTABLE CHEST 1 VIEW COMPARISON:  04/10/2019. FINDINGS: Lower lung volumes from prior exam. Borderline  cardiomegaly. Stable mediastinal contours. No acute airspace disease, pleural effusion, or pneumothorax. No pulmonary edema. No acute osseous findings. IMPRESSION: Lower lung volumes from prior exam with borderline cardiomegaly. Electronically Signed   By: Narda Rutherford M.D.   On: 04/30/2022 21:00    Procedures Procedures    Medications Ordered in ED Medications  ipratropium-albuterol (DUONEB) 0.5-2.5 (3) MG/3ML nebulizer solution (  Not Given 04/30/22 1854)  albuterol (PROVENTIL) (2.5 MG/3ML) 0.083% nebulizer solution (  Not Given 04/30/22 1854)  albuterol (PROVENTIL) (2.5 MG/3ML) 0.083% nebulizer solution (0 mg/hr Nebulization Stopped 04/30/22 2120)  ipratropium-albuterol (DUONEB) 0.5-2.5 (3) MG/3ML nebulizer solution 3 mL (3 mLs Nebulization Given 04/30/22 1854)  albuterol (PROVENTIL) (2.5 MG/3ML) 0.083% nebulizer solution 2.5 mg (2.5 mg Nebulization Given 04/30/22 1854)  methylPREDNISolone sodium succinate (SOLU-MEDROL) 125 mg/2 mL injection 125 mg (125 mg Intravenous Given 04/30/22 1911)    ED Course/ Medical Decision Making/ A&P                           Medical Decision Making Amount and/or Complexity of Data Reviewed Radiology: ordered.  Risk Prescription drug management.   Alert 56 year old male with increased effort of breathing.  Initial oxygen saturation 89 to 87% on room air.  Presents to the ED with chief complaint shortness of breath.  Information obtained from patient.  Past medical records were reviewed including previous provider notes, labs, and imaging.  Per chart review patient was seen on 04/09/2022 for similar symptoms.  Cardiac work-up at that time was reassuring.  Patient had improvement in symptoms after receiving albuterol nebulizer and Solu-Medrol.  Patient was discharged with albuterol inhaler.  Will initiate Solu-Medrol and DuoNeb treatment at this time.  Will obtain x-ray imaging as well.  I personally viewed interpret patient's x-ray imaging.  Agree with  radiology interpretation of low lung volumes with borderline cardiomegaly.  Patient had continued wheezing and shortness of breath after receiving Solu-Medrol and DuoNeb.  We will give patient continuous nebulizer treatment.  After completing continuous nebulizer treatment patient reported improvement in shortness of breath.  Patient has some minimal expiratory wheezing noted to all lung fields.  Oxygen saturation 94% on room air.  Will discharge patient at this time.  Patient was advised to establish care with PCP for further management of hypertension.  Consult was placed to pulmonology.  Refill of albuterol inhaler.  Patient care discussed with Dr. Rhunette Croft.  Based on patient's chief complaint, I considered admission might be necessary, however after reassuring ED workup feel patient is reasonable for discharge.  Discussed results, findings, treatment and follow up. Patient advised of return precautions. Patient verbalized understanding and agreed with plan.  Portions of this note were  generated with Scientist, clinical (histocompatibility and immunogenetics). Dictation errors may occur despite best attempts at proofreading.         Final Clinical Impression(s) / ED Diagnoses Final diagnoses:  None    Rx / DC Orders ED Discharge Orders     None         Berneice Heinrich 04/30/22 2145    Derwood Kaplan, MD 04/30/22 2320

## 2022-04-30 NOTE — ED Triage Notes (Signed)
Pt ran out of albuterol inhaler 2 days ago. Started having shortness or breath and cough today. Same symptoms as visit on 7/16.

## 2022-04-30 NOTE — Discharge Instructions (Addendum)
You came to the emergency department today to be evaluated for your wheezing and shortness of breath.  Your symptoms improved after receiving nebulizer albuterol treatments.  I have given you a refill of your albuterol inhaler.  I have also placed in a referral to [facility] pulmonology, if you do not hear from them in 3 business days please call to schedule follow-up appointment.  As we discussed please establish care with a primary care doctor for further management of your blood pressure.  Get help right away if: Your shortness of breath gets worse. You have shortness of breath when you are resting. You feel light-headed or you faint. You have a cough that is not controlled with medicines. You cough up blood. You have pain with breathing. You have pain in your chest, arms, shoulders, or abdomen. You have a fever.

## 2022-05-30 ENCOUNTER — Encounter (HOSPITAL_COMMUNITY): Payer: Self-pay

## 2022-05-30 ENCOUNTER — Encounter (HOSPITAL_BASED_OUTPATIENT_CLINIC_OR_DEPARTMENT_OTHER): Payer: Self-pay | Admitting: Emergency Medicine

## 2022-05-30 ENCOUNTER — Emergency Department (HOSPITAL_BASED_OUTPATIENT_CLINIC_OR_DEPARTMENT_OTHER): Payer: BC Managed Care – PPO

## 2022-05-30 ENCOUNTER — Observation Stay (HOSPITAL_BASED_OUTPATIENT_CLINIC_OR_DEPARTMENT_OTHER)
Admission: EM | Admit: 2022-05-30 | Discharge: 2022-06-01 | Disposition: A | Discharge status: Home / Self Care | Payer: BC Managed Care – PPO | Attending: Internal Medicine | Admitting: Internal Medicine

## 2022-05-30 ENCOUNTER — Other Ambulatory Visit: Payer: Self-pay

## 2022-05-30 DIAGNOSIS — E663 Overweight: Secondary | ICD-10-CM | POA: Insufficient documentation

## 2022-05-30 DIAGNOSIS — R0602 Shortness of breath: Secondary | ICD-10-CM | POA: Diagnosis present

## 2022-05-30 DIAGNOSIS — Z20822 Contact with and (suspected) exposure to covid-19: Secondary | ICD-10-CM | POA: Insufficient documentation

## 2022-05-30 DIAGNOSIS — F17210 Nicotine dependence, cigarettes, uncomplicated: Secondary | ICD-10-CM | POA: Diagnosis not present

## 2022-05-30 DIAGNOSIS — Z79899 Other long term (current) drug therapy: Secondary | ICD-10-CM | POA: Insufficient documentation

## 2022-05-30 DIAGNOSIS — Z720 Tobacco use: Secondary | ICD-10-CM | POA: Diagnosis present

## 2022-05-30 DIAGNOSIS — J45901 Unspecified asthma with (acute) exacerbation: Principal | ICD-10-CM | POA: Insufficient documentation

## 2022-05-30 DIAGNOSIS — J9601 Acute respiratory failure with hypoxia: Secondary | ICD-10-CM | POA: Diagnosis not present

## 2022-05-30 DIAGNOSIS — I10 Essential (primary) hypertension: Secondary | ICD-10-CM | POA: Diagnosis not present

## 2022-05-30 LAB — CBC WITH DIFFERENTIAL/PLATELET
Abs Immature Granulocytes: 0.02 10*3/uL (ref 0.00–0.07)
Basophils Absolute: 0.1 10*3/uL (ref 0.0–0.1)
Basophils Relative: 1 %
Eosinophils Absolute: 1.2 10*3/uL — ABNORMAL HIGH (ref 0.0–0.5)
Eosinophils Relative: 14 %
HCT: 43 % (ref 39.0–52.0)
Hemoglobin: 14.7 g/dL (ref 13.0–17.0)
Immature Granulocytes: 0 %
Lymphocytes Relative: 19 %
Lymphs Abs: 1.7 10*3/uL (ref 0.7–4.0)
MCH: 31.2 pg (ref 26.0–34.0)
MCHC: 34.2 g/dL (ref 30.0–36.0)
MCV: 91.3 fL (ref 80.0–100.0)
Monocytes Absolute: 0.9 10*3/uL (ref 0.1–1.0)
Monocytes Relative: 10 %
Neutro Abs: 4.8 10*3/uL (ref 1.7–7.7)
Neutrophils Relative %: 56 %
Platelets: 224 10*3/uL (ref 150–400)
RBC: 4.71 MIL/uL (ref 4.22–5.81)
RDW: 12.7 % (ref 11.5–15.5)
WBC: 8.6 10*3/uL (ref 4.0–10.5)
nRBC: 0 % (ref 0.0–0.2)

## 2022-05-30 LAB — SARS CORONAVIRUS 2 BY RT PCR: SARS Coronavirus 2 by RT PCR: NEGATIVE

## 2022-05-30 LAB — BASIC METABOLIC PANEL
Anion gap: 8 (ref 5–15)
BUN: 22 mg/dL — ABNORMAL HIGH (ref 6–20)
CO2: 26 mmol/L (ref 22–32)
Calcium: 8.8 mg/dL — ABNORMAL LOW (ref 8.9–10.3)
Chloride: 105 mmol/L (ref 98–111)
Creatinine, Ser: 1.22 mg/dL (ref 0.61–1.24)
GFR, Estimated: >60 mL/min (ref >60)
Glucose, Bld: 93 mg/dL (ref 70–99)
Potassium: 3.7 mmol/L (ref 3.5–5.1)
Sodium: 139 mmol/L (ref 135–145)

## 2022-05-30 MED ORDER — METHYLPREDNISOLONE SODIUM SUCC 125 MG IJ SOLR
125.0000 mg | Freq: Once | INTRAMUSCULAR | Status: AC
  Administered 2022-05-30: 125 mg via INTRAVENOUS
  Filled 2022-05-30: qty 2

## 2022-05-30 MED ORDER — IPRATROPIUM BROMIDE 0.02 % IN SOLN
0.5000 mg | Freq: Once | RESPIRATORY_TRACT | Status: AC
  Administered 2022-05-30: 0.5 mg via RESPIRATORY_TRACT
  Filled 2022-05-30: qty 2.5

## 2022-05-30 MED ORDER — ALBUTEROL SULFATE (2.5 MG/3ML) 0.083% IN NEBU
INHALATION_SOLUTION | RESPIRATORY_TRACT | Status: AC
  Administered 2022-05-30: 5 mg via RESPIRATORY_TRACT
  Filled 2022-05-30: qty 6

## 2022-05-30 MED ORDER — MAGNESIUM SULFATE 2 GM/50ML IV SOLN
2.0000 g | Freq: Once | INTRAVENOUS | Status: AC
  Administered 2022-05-30: 2 g via INTRAVENOUS
  Filled 2022-05-30: qty 50

## 2022-05-30 MED ORDER — IPRATROPIUM-ALBUTEROL 0.5-2.5 (3) MG/3ML IN SOLN
3.0000 mL | RESPIRATORY_TRACT | Status: DC
  Administered 2022-05-30 – 2022-05-31 (×2): 3 mL via RESPIRATORY_TRACT
  Filled 2022-05-30 (×2): qty 3

## 2022-05-30 MED ORDER — ALBUTEROL SULFATE (2.5 MG/3ML) 0.083% IN NEBU: 5.0000 mg | INHALATION_SOLUTION | Freq: Once | RESPIRATORY_TRACT | Status: AC

## 2022-05-30 NOTE — ED Provider Notes (Signed)
[facility] EMERGENCY DEPARTMENT Provider Note   CSN: [id number] Arrival date & time: 05/30/22  2007     History  Chief Complaint  Patient presents with   Shortness of Breath    Benjamin Decker is a 56 y.o. male.  Patient here with shortness of breath and wheezing.  History of asthma.  Denies any chest pain or fever chills or sputum production.  Patient hypoxic when he arrived in triage and brought back to the room.  He feels better now with some oxygen.  He is on 2 L of oxygen.  He states he was with asthma exacerbation last month.  Finished a course of steroids and was feeling better.  He has been short of breath for day.  Poor air movement.  Nothing makes it worse or better.  Denies any abdominal pain or weakness or numbness.  The history is provided by the patient.       Home Medications Prior to Admission medications   Medication Sig Start Date End Date Taking? Authorizing Provider  albuterol (VENTOLIN HFA) 108 (90 Base) MCG/ACT inhaler Inhale 1-2 puffs into the lungs every 4 (four) hours as needed for wheezing or shortness of breath. 04/30/22   Haskel Schroeder, PA-C  amLODipine (NORVASC) 5 MG tablet Take 1 tablet (5 mg total) by mouth daily. 02/27/22   Prosperi, Christian H, PA-C  amLODipine (NORVASC) 5 MG tablet Take 1 tablet (5 mg total) by mouth daily. 04/09/22   Vanetta Mulders, MD  predniSONE (DELTASONE) 10 MG tablet Take 4 tablets (40 mg total) by mouth daily. 04/09/22   Vanetta Mulders, MD      Allergies    Patient has no known allergies.    Review of Systems   Review of Systems  Physical Exam Updated Vital Signs BP (!) 137/90   Pulse 91   Temp 97.9 F (36.6 C) (Oral)   Resp 19   Ht 5\' 3"  (1.6 m)   Wt 68.9 kg   SpO2 97%   BMI 26.93 kg/m  Physical Exam Vitals and nursing note reviewed.  Constitutional:      General: He is not in acute distress.    Appearance: He is well-developed. He is not ill-appearing.  HENT:     Head: Normocephalic  and atraumatic.     Mouth/Throat:     Mouth: Mucous membranes are moist.  Eyes:     Conjunctiva/sclera: Conjunctivae normal.     Pupils: Pupils are equal, round, and reactive to light.  Cardiovascular:     Rate and Rhythm: Normal rate and regular rhythm.     Pulses: Normal pulses.     Heart sounds: Normal heart sounds. No murmur heard. Pulmonary:     Effort: Tachypnea present. No respiratory distress.     Breath sounds: Decreased breath sounds and wheezing present.  Abdominal:     Palpations: Abdomen is soft.     Tenderness: There is no abdominal tenderness.  Musculoskeletal:        General: No swelling.     Cervical back: Normal range of motion and neck supple.  Skin:    General: Skin is warm and dry.     Capillary Refill: Capillary refill takes less than 2 seconds.  Neurological:     General: No focal deficit present.     Mental Status: He is alert.  Psychiatric:        Mood and Affect: Mood normal.     ED Results / Procedures / Treatments   Labs (  all labs ordered are listed, but only abnormal results are displayed) Labs Reviewed  CBC WITH DIFFERENTIAL/PLATELET - Abnormal; Notable for the following components:      Result Value   Eosinophils Absolute 1.2 (*)    All other components within normal limits  BASIC METABOLIC PANEL - Abnormal; Notable for the following components:   BUN 22 (*)    Calcium 8.8 (*)    All other components within normal limits  SARS CORONAVIRUS 2 BY RT PCR    EKG None  Radiology DG Chest Portable 1 View  Result Date: 05/30/2022 CLINICAL DATA:  Shortness of breath EXAM: PORTABLE CHEST 1 VIEW COMPARISON:  04/30/2022 FINDINGS: Transverse diameter of heart is slightly increased. There are no signs of pulmonary edema or focal pulmonary consolidation. There is no pleural effusion or pneumothorax. IMPRESSION: There are no signs of pulmonary edema or focal pulmonary consolidation. Electronically Signed   By: Ernie Avena M.D.   On: 05/30/2022  20:56    Procedures Procedures    Medications Ordered in ED Medications  albuterol (PROVENTIL) (2.5 MG/3ML) 0.083% nebulizer solution 5 mg (5 mg Nebulization Given 05/30/22 2033)  ipratropium (ATROVENT) nebulizer solution 0.5 mg (0.5 mg Nebulization Given 05/30/22 2053)  methylPREDNISolone sodium succinate (SOLU-MEDROL) 125 mg/2 mL injection 125 mg (125 mg Intravenous Given 05/30/22 2044)  magnesium sulfate IVPB 2 g 50 mL (0 g Intravenous Stopped 05/30/22 2141)    ED Course/ Medical Decision Making/ A&P                           Medical Decision Making Amount and/or Complexity of Data Reviewed Labs: ordered. Radiology: ordered.  Risk Prescription drug management. Decision regarding hospitalization.   Benjamin Decker is here with shortness of breath.  Unremarkable vitals except for hypoxia in the 80s that improved on 2 L of oxygen.  History of asthma.  Wheezing and diminished breath sounds throughout.  Differential diagnosis likely asthma exacerbation.  Could be pneumonia/viral process.  I have no concern for ACS or PE.  EKG per my review and interpretation shows sinus rhythm.  No ischemic changes.  We will get COVID test, chest x-ray, CBC, BMP.  We will give breathing treatment, magnesium, steroids and reevaluate.  Per my review and interpretation of labs is no significant anemia, electrolyte abnormality, kidney injury.  Chest x-ray with no evidence of pneumonia or pneumothorax per my review and interpretation.  COVID test is negative.  Overall suspect asthma exacerbation with new hypoxia.  Feeling slightly improved following breathing treatment.  Will admit for further care.  This chart was dictated using voice recognition software.  Despite best efforts to proofread,  errors can occur which can change the documentation meaning.         Final Clinical Impression(s) / ED Diagnoses Final diagnoses:  Acute respiratory failure with hypoxia (HCC)  Mild asthma with exacerbation,  unspecified whether persistent    Rx / DC Orders ED Discharge Orders     None         Virgina Norfolk, DO 05/30/22 2155

## 2022-05-30 NOTE — Plan of Care (Signed)
[facility] will assume care on arrival to accepting facility. Until arrival, care as per EDP. However, [facility] available 24/7 for questions and assistance.  Nursing staff, please page [facility] Admits and Consults ([phone]) as soon as the patient arrives to the hospital.   

## 2022-05-30 NOTE — ED Triage Notes (Signed)
Patient arrived via POV c/o shortness of breath x 1 day. Patient seen by respiratory in lobby. Patient lung sounds are tight w/ wheezing. Patient has low SPO2 in triage. Patient is AO x 4, VS w/ low SPO2, normal gait.

## 2022-05-31 ENCOUNTER — Encounter (HOSPITAL_COMMUNITY): Payer: Self-pay | Admitting: Internal Medicine

## 2022-05-31 DIAGNOSIS — J9601 Acute respiratory failure with hypoxia: Secondary | ICD-10-CM

## 2022-05-31 DIAGNOSIS — I10 Essential (primary) hypertension: Secondary | ICD-10-CM | POA: Diagnosis present

## 2022-05-31 DIAGNOSIS — F17210 Nicotine dependence, cigarettes, uncomplicated: Secondary | ICD-10-CM | POA: Diagnosis not present

## 2022-05-31 DIAGNOSIS — Z720 Tobacco use: Secondary | ICD-10-CM

## 2022-05-31 DIAGNOSIS — Z79899 Other long term (current) drug therapy: Secondary | ICD-10-CM | POA: Diagnosis not present

## 2022-05-31 DIAGNOSIS — J45901 Unspecified asthma with (acute) exacerbation: Secondary | ICD-10-CM | POA: Diagnosis not present

## 2022-05-31 DIAGNOSIS — R0602 Shortness of breath: Secondary | ICD-10-CM | POA: Diagnosis present

## 2022-05-31 DIAGNOSIS — E663 Overweight: Secondary | ICD-10-CM | POA: Diagnosis not present

## 2022-05-31 DIAGNOSIS — Z20822 Contact with and (suspected) exposure to covid-19: Secondary | ICD-10-CM | POA: Diagnosis not present

## 2022-05-31 LAB — PHOSPHORUS: Phosphorus: 2.7 mg/dL (ref 2.5–4.6)

## 2022-05-31 MED ORDER — LORATADINE 10 MG PO TABS
10.0000 mg | ORAL_TABLET | Freq: Every day | ORAL | Status: DC
  Administered 2022-06-01: 10 mg via ORAL
  Filled 2022-05-31: qty 1

## 2022-05-31 MED ORDER — PREDNISONE 20 MG PO TABS
40.0000 mg | ORAL_TABLET | Freq: Every day | ORAL | Status: DC
  Administered 2022-05-31 – 2022-06-01 (×2): 40 mg via ORAL
  Filled 2022-05-31 (×2): qty 2

## 2022-05-31 MED ORDER — AMLODIPINE BESYLATE 10 MG PO TABS
5.0000 mg | ORAL_TABLET | Freq: Every day | ORAL | Status: DC
  Administered 2022-05-31 – 2022-06-01 (×2): 5 mg via ORAL
  Filled 2022-05-31 (×2): qty 1

## 2022-05-31 MED ORDER — IPRATROPIUM-ALBUTEROL 0.5-2.5 (3) MG/3ML IN SOLN
3.0000 mL | Freq: Four times a day (QID) | RESPIRATORY_TRACT | Status: DC
  Administered 2022-05-31: 3 mL via RESPIRATORY_TRACT
  Filled 2022-05-31: qty 3

## 2022-05-31 MED ORDER — ACETAMINOPHEN 650 MG RE SUPP: 650.0000 mg | Freq: Four times a day (QID) | RECTAL | Status: DC | PRN

## 2022-05-31 MED ORDER — ONDANSETRON HCL 4 MG/2ML IJ SOLN: 4.0000 mg | Freq: Four times a day (QID) | INTRAMUSCULAR | Status: DC | PRN

## 2022-05-31 MED ORDER — ACETAMINOPHEN 325 MG PO TABS: 650.0000 mg | ORAL_TABLET | Freq: Four times a day (QID) | ORAL | Status: DC | PRN

## 2022-05-31 MED ORDER — ONDANSETRON HCL 4 MG PO TABS: 4.0000 mg | ORAL_TABLET | Freq: Four times a day (QID) | ORAL | Status: DC | PRN

## 2022-05-31 MED ORDER — ALBUTEROL SULFATE (2.5 MG/3ML) 0.083% IN NEBU: 2.5000 mg | INHALATION_SOLUTION | RESPIRATORY_TRACT | Status: DC | PRN

## 2022-05-31 MED ORDER — IPRATROPIUM-ALBUTEROL 0.5-2.5 (3) MG/3ML IN SOLN
3.0000 mL | Freq: Three times a day (TID) | RESPIRATORY_TRACT | Status: DC
Start: 2022-05-31 — End: 2022-06-01
  Administered 2022-05-31 – 2022-06-01 (×3): 3 mL via RESPIRATORY_TRACT
  Filled 2022-05-31 (×3): qty 3

## 2022-05-31 MED ORDER — ENOXAPARIN SODIUM 40 MG/0.4ML IJ SOSY
40.0000 mg | PREFILLED_SYRINGE | INTRAMUSCULAR | Status: DC
  Administered 2022-05-31 – 2022-06-01 (×2): 40 mg via SUBCUTANEOUS
  Filled 2022-05-31 (×2): qty 0.4

## 2022-05-31 NOTE — ED Notes (Signed)
ED TO INPATIENT HANDOFF REPORT  ED Nurse Name and Phone #: Gaspar Garbe [phone]  S Name/Age/Gender Benjamin Decker [birth year] 56 y.o. male Room/Bed: MH02/MH02  Code Status   Code Status: Not on file  Home/SNF/Other Home Patient oriented to: Person,Place, Time, Event Is this baseline? Yes   Triage Complete: Triage complete  Chief Complaint Acute asthma exacerbation [J45.901]  Triage Note Patient arrived via POV c/o shortness of breath x 1 day. Patient seen by respiratory in lobby. Patient lung sounds are tight w/ wheezing. Patient has low SPO2 in triage. Patient is AO x 4, VS w/ low SPO2, normal gait.   Allergies No Known Allergies  Level of Care/Admitting Diagnosis ED Disposition     ED Disposition  Admit   Condition  --   Comment  Hospital Area: [facility] [[id number]]  Level of Care: Telemetry [5]  Admit to tele based on following criteria: Complex arrhythmia (Bradycardia/Tachycardia)  Interfacility transfer: Yes  May place patient in observation at [facility] or [facility] if equivalent level of care is available:: Yes  Covid Evaluation: Asymptomatic - no recent exposure (last 10 days) testing not required  Diagnosis: Acute asthma exacerbation [621308]  Admitting Physician: John Giovanni [[id number]]  Attending Physician: Virgina Norfolk [[id number]]          B Medical/Surgery History History reviewed. No pertinent past medical history. History reviewed. No pertinent surgical history.   A IV Location/Drains/Wounds Patient Lines/Drains/Airways Status     Active Line/Drains/Airways     Name Placement date Placement time Site Days   Peripheral IV 05/30/22 20 G Right;Upper Arm 05/30/22  2042  Arm  1            Intake/Output Last 24 hours No intake or output data in the 24 hours ending 05/31/22 0102  Labs/Imaging Results for orders placed or performed during the hospital encounter of 05/30/22 (from the past 48 hour(s))  SARS Coronavirus 2  by RT PCR (hospital order, performed in [facility] hospital lab) *cepheid single result test* Anterior Nasal Swab     Status: None   Collection Time: 05/30/22  8:22 PM   Specimen: Anterior Nasal Swab  Result Value Ref Range   SARS Coronavirus 2 by RT PCR NEGATIVE NEGATIVE    Comment: (NOTE) SARS-CoV-2 target nucleic acids are NOT DETECTED.  The SARS-CoV-2 RNA is generally detectable in upper and lower respiratory specimens during the acute phase of infection. The lowest concentration of SARS-CoV-2 viral copies this assay can detect is 250 copies / mL. A negative result does not preclude SARS-CoV-2 infection and should not be used as the sole basis for treatment or other patient management decisions.  A negative result may occur with improper specimen collection / handling, submission of specimen other than nasopharyngeal swab, presence of viral mutation(s) within the areas targeted by this assay, and inadequate number of viral copies (<250 copies / mL). A negative result must be combined with clinical observations, patient history, and epidemiological information.  Fact Sheet for Patients:   RoadLapTop.co.za  Fact Sheet for Healthcare Providers: http://kim-miller.com/  This test is not yet approved or  cleared by the [place] FDA and has been authorized for detection and/or diagnosis of SARS-CoV-2 by FDA under an Emergency Use Authorization (EUA).  This EUA will remain in effect (meaning this test can be used) for the duration of the COVID-19 declaration under Section 564(b)(1) of the Act, 21 U.S.C. section 360bbb-3(b)(1), unless the authorization is terminated or revoked sooner.  Performed at [facility]  [place], [address] [place], [place] [address]   CBC with Differential     Status: Abnormal   Collection Time: 05/30/22  8:45 PM  Result Value Ref Range   WBC 8.6 4.0 - 10.5 K/uL   RBC 4.71 4.22 - 5.81 MIL/uL    Hemoglobin 14.7 13.0 - 17.0 g/dL   HCT 42.3 53.6 - 14.4 %   MCV 91.3 80.0 - 100.0 fL   MCH 31.2 26.0 - 34.0 pg   MCHC 34.2 30.0 - 36.0 g/dL   RDW 31.5 40.0 - 86.7 %   Platelets 224 150 - 400 K/uL   nRBC 0.0 0.0 - 0.2 %   Neutrophils Relative % 56 %   Neutro Abs 4.8 1.7 - 7.7 K/uL   Lymphocytes Relative 19 %   Lymphs Abs 1.7 0.7 - 4.0 K/uL   Monocytes Relative 10 %   Monocytes Absolute 0.9 0.1 - 1.0 K/uL   Eosinophils Relative 14 %   Eosinophils Absolute 1.2 (H) 0.0 - 0.5 K/uL   Basophils Relative 1 %   Basophils Absolute 0.1 0.0 - 0.1 K/uL   Immature Granulocytes 0 %   Abs Immature Granulocytes 0.02 0.00 - 0.07 K/uL    Comment: Performed at [facility], [address] [place], [place] [address]  Basic metabolic panel     Status: Abnormal   Collection Time: 05/30/22  8:45 PM  Result Value Ref Range   Sodium 139 135 - 145 mmol/L   Potassium 3.7 3.5 - 5.1 mmol/L   Chloride 105 98 - 111 mmol/L   CO2 26 22 - 32 mmol/L   Glucose, Bld 93 70 - 99 mg/dL    Comment: Glucose reference range applies only to samples taken after fasting for at least 8 hours.   BUN 22 (H) 6 - 20 mg/dL   Creatinine, Ser 9.32 0.61 - 1.24 mg/dL   Calcium 8.8 (L) 8.9 - 10.3 mg/dL   GFR, Estimated >67 >12 mL/min    Comment: (NOTE) Calculated using the CKD-EPI Creatinine Equation (2021)    Anion gap 8 5 - 15    Comment: Performed at [facility], [address] [place], [place] [address]   DG Chest Portable 1 View  Result Date: 05/30/2022 CLINICAL DATA:  Shortness of breath EXAM: PORTABLE CHEST 1 VIEW COMPARISON:  04/30/2022 FINDINGS: Transverse diameter of heart is slightly increased. There are no signs of pulmonary edema or focal pulmonary consolidation. There is no pleural effusion or pneumothorax. IMPRESSION: There are no signs of pulmonary edema or focal pulmonary consolidation. Electronically Signed   By: Ernie Avena M.D.   On: 05/30/2022 20:56    Pending Labs Unresulted  Labs (From admission, onward)    None       Vitals/Pain Today's Vitals   05/31/22 0015 05/31/22 0025 05/31/22 0030 05/31/22 0045  BP: (!) 141/100  (!) 137/101 (!) 139/93  Pulse: 88  84 82  Resp: (!) 25  18 17   Temp:  97.8 F (36.6 C)    TempSrc:  Oral    SpO2: 91%  93% 97%  Weight:      Height:      PainSc:        Isolation Precautions No active isolations  Medications Medications  ipratropium-albuterol (DUONEB) 0.5-2.5 (3) MG/3ML nebulizer solution 3 mL (3 mLs Nebulization Given 05/30/22 2228)  albuterol (PROVENTIL) (2.5 MG/3ML) 0.083% nebulizer solution 5 mg (5 mg Nebulization Given 05/30/22 2033)  ipratropium (ATROVENT)  nebulizer solution 0.5 mg (0.5 mg Nebulization Given 05/30/22 2053)  methylPREDNISolone sodium succinate (SOLU-MEDROL) 125 mg/2 mL injection 125 mg (125 mg Intravenous Given 05/30/22 2044)  magnesium sulfate IVPB 2 g 50 mL (0 g Intravenous Stopped 05/30/22 2141)    Mobility Low fall risk   Focused Assessments N/a   R Recommendations: See Admitting Provider Note  Report given to:   Additional Notes: Patient is CAOX4 and on 2lpm via [place]. Family is at bedside. Has a hx of COPD and prior admissions for same. Pt still smokes a few cigarettes per day. Negative for COVID.

## 2022-05-31 NOTE — Plan of Care (Signed)
  Problem: Education: Goal: Knowledge of General Education information will improve Description: Including pain rating scale, medication(s)/side effects and non-pharmacologic comfort measures Outcome: Progressing   Problem: Activity: Goal: Risk for activity intolerance will decrease Outcome: Progressing   Problem: Pain Managment: Goal: General experience of comfort will improve Outcome: Progressing   Problem: Safety: Goal: Ability to remain free from injury will improve Outcome: Progressing   Problem: Respiratory: Goal: Levels of oxygenation will improve Outcome: Progressing

## 2022-05-31 NOTE — H&P (Signed)
History and Physical    Patient: Benjamin Decker DOB: [birth year] DOA: 05/30/2022 DOS: the patient was seen and examined on 05/31/2022 PCP: System, Provider Not In  Patient coming from: Home  Chief Complaint:  Chief Complaint  Patient presents with   Shortness of Breath   HPI: Benjamin Decker is a 56 y.o. male with medical history significant of hypertension, asthma, active tobacco use who is coming to the emergency department due to progressively worse dyspnea for the past 2 days associated with fatigue and wheezing.  He denied fever, chills, rhinorrhea, sore throat, wheezing or hemoptysis.  No chest pain, palpitations, diaphoresis, PND, orthopnea or pitting edema of the lower extremities.  No abdominal pain, nausea, emesis, diarrhea, constipation, melena or hematochezia.  No flank pain, dysuria, frequency or hematuria.  No polyuria, polydipsia, polyphagia or blurred vision.   ED course: Initial vital signs were temperature 97.9 F, pulse 87, respirations 24, BP 140/99 mmHg O2 sat 88% on room air.  The patient received some mental oxygen, bronchodilators, magnesium sulfate 2 g IVPB and methylprednisolone 125 mg IVPB.  Lab work: His CBC is her white count 8.6, morning 14.7 g/dL platelets 269.  Coronavirus PCR was negative.  BMP showed BUN of 22 and calcium 8.9 mg deciliter.  The rest of the BMP values were normal.  Imaging: A portable 1 view chest radiograph showed very mild cardiomegaly with no signs of pulmonary edema or focal pulmonary consolidation.   Review of Systems: As mentioned in the history of present illness. All other systems reviewed and are negative.  History reviewed. No pertinent past medical history. History reviewed. No pertinent surgical history. Social History:  reports that he has been smoking cigarettes. He has never used smokeless tobacco. He reports current alcohol use. He reports that he does not use drugs.  No Known Allergies  Family medical history:  The patient denied any medical history in his family to the best of his knowledge.  Prior to Admission medications   Medication Sig Start Date End Date Taking? Authorizing Provider  albuterol (VENTOLIN HFA) 108 (90 Base) MCG/ACT inhaler Inhale 1-2 puffs into the lungs every 4 (four) hours as needed for wheezing or shortness of breath. 04/30/22   Haskel Schroeder, PA-C  amLODipine (NORVASC) 5 MG tablet Take 1 tablet (5 mg total) by mouth daily. 02/27/22   Prosperi, Christian H, PA-C  amLODipine (NORVASC) 5 MG tablet Take 1 tablet (5 mg total) by mouth daily. 04/09/22   Vanetta Mulders, MD  predniSONE (DELTASONE) 10 MG tablet Take 4 tablets (40 mg total) by mouth daily. 04/09/22   Vanetta Mulders, MD    Physical Exam: Vitals:   05/31/22 0200 05/31/22 0333 05/31/22 0513 05/31/22 0619  BP: (!) 137/96  (!) 127/90 (!) 153/105  Pulse: 97  (!) 109 (!) 104  Resp: (!) 21  (!) 22 18  Temp:    98.3 F (36.8 C)  TempSrc:    Oral  SpO2: 96% 95% 95% 98%  Weight:      Height:       Physical Exam Vitals and nursing note reviewed.  Constitutional:      General: He is not in acute distress.    Interventions: Nasal cannula in place.  HENT:     Head: Normocephalic.     Nose: No rhinorrhea.     Mouth/Throat:     Mouth: Mucous membranes are moist.  Eyes:     General: No scleral icterus.    Pupils: Pupils are equal, round,  and reactive to light.  Neck:     Vascular: No JVD.  Cardiovascular:     Rate and Rhythm: Normal rate and regular rhythm.     Heart sounds: S1 normal and S2 normal.  Pulmonary:     Breath sounds: Examination of the right-lower field reveals decreased breath sounds. Examination of the left-lower field reveals decreased breath sounds. Decreased breath sounds, wheezing and rhonchi present.  Abdominal:     General: Bowel sounds are normal.     Palpations: Abdomen is soft.     Tenderness: There is no abdominal tenderness.  Musculoskeletal:     Cervical back: Neck supple.      Right lower leg: No edema.     Left lower leg: No edema.  Skin:    General: Skin is warm and dry.  Neurological:     General: No focal deficit present.     Mental Status: He is alert and oriented to person, place, and time.  Psychiatric:        Mood and Affect: Mood normal.        Behavior: Behavior normal.   Data Reviewed:  There are no new results to review at this time.  Assessment and Plan: Principal Problem:   Acute respiratory failure with hypoxia (HCC) In the setting of:   Acute asthma exacerbation Observation/telemetry Continue supplemental oxygen. Methylprednisolone 125 mg IVP x1 given. Followed by prednisone 40 mg p.o. daily in a.m. Scheduled and as needed bronchodilators. Follow-up CBC and chemistry in the morning.  Smoking cessation advised.  Active Problems:   Hypertension Continue amlodipine 5 mg p.o. daily Monitor BP and heart rate.    Tobacco use Nicotine replacement therapy declined. Smoking cessation. Follow-up closely with PCP.    Overweight Lifestyle modifications. Follow-up with primary care provider     Advance Care Planning:   Code Status: Full code.  Consults:   Family Communication:   Severity of Illness: The appropriate patient status for this patient is OBSERVATION. Observation status is judged to be reasonable and necessary in order to provide the required intensity of service to ensure the patient's safety. The patient's presenting symptoms, physical exam findings, and initial radiographic and laboratory data in the context of their medical condition is felt to place them at decreased risk for further clinical deterioration. Furthermore, it is anticipated that the patient will be medically stable for discharge from the hospital within 2 midnights of admission.   Author: Bobette Mo, MD 05/31/2022 7:44 AM  For on call review [email or web address].   This document was prepared using Dragon voice recognition software and may contain  some unintended transcription errors.

## 2022-06-01 DIAGNOSIS — J45901 Unspecified asthma with (acute) exacerbation: Secondary | ICD-10-CM | POA: Diagnosis not present

## 2022-06-01 LAB — COMPREHENSIVE METABOLIC PANEL
ALT: 19 U/L (ref 0–44)
AST: 26 U/L (ref 15–41)
Albumin: 3.8 g/dL (ref 3.5–5.0)
Alkaline Phosphatase: 62 U/L (ref 38–126)
Anion gap: 8 (ref 5–15)
BUN: 28 mg/dL — ABNORMAL HIGH (ref 6–20)
CO2: 24 mmol/L (ref 22–32)
Calcium: 9.3 mg/dL (ref 8.9–10.3)
Chloride: 109 mmol/L (ref 98–111)
Creatinine, Ser: 1.23 mg/dL (ref 0.61–1.24)
GFR, Estimated: >60 mL/min (ref >60)
Glucose, Bld: 110 mg/dL — ABNORMAL HIGH (ref 70–99)
Potassium: 3.7 mmol/L (ref 3.5–5.1)
Sodium: 141 mmol/L (ref 135–145)
Total Bilirubin: 1 mg/dL (ref 0.3–1.2)
Total Protein: 6.8 g/dL (ref 6.5–8.1)

## 2022-06-01 LAB — HIV ANTIBODY (ROUTINE TESTING W REFLEX): HIV Screen 4th Generation wRfx: NONREACTIVE

## 2022-06-01 MED ORDER — PREDNISONE 20 MG PO TABS: 40.0000 mg | ORAL_TABLET | Freq: Every day | ORAL | 0 refills | Status: AC

## 2022-06-01 MED ORDER — ALBUTEROL SULFATE HFA 108 (90 BASE) MCG/ACT IN AERS: 1.0000 | INHALATION_SPRAY | RESPIRATORY_TRACT | 2 refills | Status: AC | PRN

## 2022-06-01 NOTE — Progress Notes (Signed)
Pt reports having ran out of his inhaler at home resulting in him seeking treatment.

## 2022-06-01 NOTE — Plan of Care (Signed)

## 2022-06-01 NOTE — TOC Transition Note (Signed)
Transition of Care [facility]) - CM/SW Discharge Note   Patient Details  Name: [name] MRN: [id number] Date of Birth: 1965/12/05  Transition of Care [facility]) CM/SW Contact:  Lavenia Atlas, RN Phone Number: 06/01/2022, 10:19 AM   Clinical Narrative:   Received TOC consult for medication assistance for inhaler. Patient has medical insurance and does not qualify for the [facility] program. No additional TOC needs.      Final next level of care: Home/Self Care Barriers to Discharge: No Barriers Identified   Patient Goals and CMS Choice Patient states their goals for this hospitalization and ongoing recovery are:: return home   Choice offered to / list presented to : NA  Discharge Placement                       Discharge Plan and Services                DME Arranged: N/A DME Agency: NA       HH Arranged: NA          Social Determinants of Health (SDOH) Interventions     Readmission Risk Interventions     No data to display

## 2022-06-01 NOTE — Progress Notes (Signed)
SATURATION QUALIFICATIONS: (This note is used to comply with regulatory documentation for home oxygen)  Patient Saturations on Room Air at Rest = 97%  Patient Saturations on Room Air while Ambulating = 95%  Patient Saturations on N/A Liters of oxygen while Ambulating = N/A%  Please briefly explain why patient needs home oxygen: 

## 2022-06-01 NOTE — Discharge Summary (Signed)
Physician Discharge Summary  Karan Ramnauth WPY:[id number] DOB: [birth year] DOA: 05/30/2022  PCP: System, Provider Not In  Admit date: 05/30/2022 Discharge date: 06/01/2022  Admitted From: home Discharge disposition: home   Recommendations for Outpatient Follow-Up:   Smoking cessation Referral to pulmonology for PFTs   Discharge Diagnosis:   Principal Problem:   Acute asthma exacerbation Active Problems:   Hypertension   Tobacco use   Overweight   Acute respiratory failure with hypoxia (HCC)    Discharge Condition: Improved.  Diet recommendation: Low sodium, heart healthy  Wound care: None.  Code status: Full.   History of Present Illness:   Benjamin Decker is a 57 y.o. male with medical history significant of hypertension, asthma, active tobacco use who is coming to the emergency department due to progressively worse dyspnea for the past 2 days associated with fatigue and wheezing.  He denied fever, chills, rhinorrhea, sore throat, wheezing or hemoptysis.  No chest pain, palpitations, diaphoresis, PND, orthopnea or pitting edema of the lower extremities.  No abdominal pain, nausea, emesis, diarrhea, constipation, melena or hematochezia.  No flank pain, dysuria, frequency or hematuria.  No polyuria, polydipsia, polyphagia or blurred vision.    ED course: Initial vital signs were temperature 97.9 F, pulse 87, respirations 24, BP 140/99 mmHg O2 sat 88% on room air.  The patient received some mental oxygen, bronchodilators, magnesium sulfate 2 g IVPB and methylprednisolone 125 mg IVPB.   Hospital Course by Problem:   Acute respiratory failure with hypoxia (HCC) In the setting of:   Acute asthma exacerbation Observation/telemetry Continue supplemental oxygen. Methylprednisolone 125 mg IVP x1 given. Followed by prednisone 40 mg p.o. daily in a.m. Scheduled and as needed bronchodilators. -ran out of meds so refills sent    Hypertension Continue amlodipine 5 mg  p.o. daily Monitor BP and heart rate.     Tobacco use Nicotine replacement therapy declined. Smoking cessation. Follow-up closely with PCP.     Overweight Lifestyle modifications. Follow-up with primary care provider    Medical Consultants:    none  Discharge Exam:   Vitals:   06/01/22 0456 06/01/22 0749  BP: 137/83   Pulse: 74   Resp:    Temp: 98.1 F (36.7 C)   SpO2: 95% 97%   Vitals:   05/31/22 1438 05/31/22 2101 06/01/22 0456 06/01/22 0749  BP:  (!) 141/105 137/83   Pulse:  100 74   Resp: 20 17    Temp:  97.9 F (36.6 C) 98.1 F (36.7 C)   TempSrc:  Oral Oral   SpO2: 96% 98% 95% 97%  Weight:      Height:        General exam: Appears calm and comfortable.  Clear, no wheezing.    The results of significant diagnostics from this hospitalization (including imaging, microbiology, ancillary and laboratory) are listed below for reference.     Procedures and Diagnostic Studies:   DG Chest Portable 1 View  Result Date: 05/30/2022 CLINICAL DATA:  Shortness of breath EXAM: PORTABLE CHEST 1 VIEW COMPARISON:  04/30/2022 FINDINGS: Transverse diameter of heart is slightly increased. There are no signs of pulmonary edema or focal pulmonary consolidation. There is no pleural effusion or pneumothorax. IMPRESSION: There are no signs of pulmonary edema or focal pulmonary consolidation. Electronically Signed   By: Ernie Avena M.D.   On: 05/30/2022 20:56     Labs:   Basic Metabolic Panel: Recent Labs  Lab 05/30/22 2045 05/31/22 0852 06/01/22 0432  NA 139  --  141  K 3.7  --  3.7  CL 105  --  109  CO2 26  --  24  GLUCOSE 93  --  110*  BUN 22*  --  28*  CREATININE 1.22  --  1.23  CALCIUM 8.8*  --  9.3  PHOS  --  2.7  --    GFR Estimated Creatinine Clearance: 58.5 mL/min (by C-G formula based on SCr of 1.23 mg/dL). Liver Function Tests: Recent Labs  Lab 06/01/22 0432  AST 26  ALT 19  ALKPHOS 62  BILITOT 1.0  PROT 6.8  ALBUMIN 3.8   No  results for input(s): "LIPASE", "AMYLASE" in the last 168 hours. No results for input(s): "AMMONIA" in the last 168 hours. Coagulation profile No results for input(s): "INR", "PROTIME" in the last 168 hours.  CBC: Recent Labs  Lab 05/30/22 2045  WBC 8.6  NEUTROABS 4.8  HGB 14.7  HCT 43.0  MCV 91.3  PLT 224   Cardiac Enzymes: No results for input(s): "CKTOTAL", "CKMB", "CKMBINDEX", "TROPONINI" in the last 168 hours. BNP: Invalid input(s): "POCBNP" CBG: No results for input(s): "GLUCAP" in the last 168 hours. D-Dimer No results for input(s): "DDIMER" in the last 72 hours. Hgb A1c No results for input(s): "HGBA1C" in the last 72 hours. Lipid Profile No results for input(s): "CHOL", "HDL", "LDLCALC", "TRIG", "CHOLHDL", "LDLDIRECT" in the last 72 hours. Thyroid function studies No results for input(s): "TSH", "T4TOTAL", "T3FREE", "THYROIDAB" in the last 72 hours.  Invalid input(s): "FREET3" Anemia work up No results for input(s): "VITAMINB12", "FOLATE", "FERRITIN", "TIBC", "IRON", "RETICCTPCT" in the last 72 hours. Microbiology Recent Results (from the past 240 hour(s))  SARS Coronavirus 2 by RT PCR (hospital order, performed in [facility] hospital lab) *cepheid single result test* Anterior Nasal Swab     Status: None   Collection Time: 05/30/22  8:22 PM   Specimen: Anterior Nasal Swab  Result Value Ref Range Status   SARS Coronavirus 2 by RT PCR NEGATIVE NEGATIVE Final    Comment: (NOTE) SARS-CoV-2 target nucleic acids are NOT DETECTED.  The SARS-CoV-2 RNA is generally detectable in upper and lower respiratory specimens during the acute phase of infection. The lowest concentration of SARS-CoV-2 viral copies this assay can detect is 250 copies / mL. A negative result does not preclude SARS-CoV-2 infection and should not be used as the sole basis for treatment or other patient management decisions.  A negative result may occur with improper specimen collection / handling,  submission of specimen other than nasopharyngeal swab, presence of viral mutation(s) within the areas targeted by this assay, and inadequate number of viral copies (<250 copies / mL). A negative result must be combined with clinical observations, patient history, and epidemiological information.  Fact Sheet for Patients:   RoadLapTop.co.za  Fact Sheet for Healthcare Providers: http://kim-miller.com/  This test is not yet approved or  cleared by the [place] FDA and has been authorized for detection and/or diagnosis of SARS-CoV-2 by FDA under an Emergency Use Authorization (EUA).  This EUA will remain in effect (meaning this test can be used) for the duration of the COVID-19 declaration under Section 564(b)(1) of the Act, 21 U.S.C. section 360bbb-3(b)(1), unless the authorization is terminated or revoked sooner.  Performed at [facility], [address] [place], [place] [address]      Discharge Instructions:   Discharge Instructions     Ambulatory referral to Pulmonology   Complete by: As directed    PFTs  Reason for referral: Asthma/COPD   Diet - low sodium heart healthy   Complete by: As directed    Discharge instructions   Complete by: As directed    Need to establish with PCP Stop smoking I have placed a referral to pulmonary to have pulmonary function testing done to determine at what level your lungs are functioning   Increase activity slowly   Complete by: As directed       Allergies as of 06/01/2022       Reactions   Crab (diagnostic) Anaphylaxis, Itching, Other (See Comments)   Throat closes shut   Shrimp (diagnostic) Anaphylaxis, Itching, Other (See Comments)   Throat closes shut        Medication List     TAKE these medications    albuterol 108 (90 Base) MCG/ACT inhaler Commonly known as: VENTOLIN HFA Inhale 1-2 puffs into the lungs every 4 (four) hours as needed for wheezing or  shortness of breath.   amLODipine 5 MG tablet Commonly known as: NORVASC Take 1 tablet (5 mg total) by mouth daily.   cetirizine 10 MG tablet Commonly known as: ZYRTEC Take 10 mg by mouth daily.   fluticasone 50 MCG/ACT nasal spray Commonly known as: FLONASE Place 1 spray into both nostrils daily.   predniSONE 20 MG tablet Commonly known as: DELTASONE Take 2 tablets (40 mg total) by mouth daily with breakfast. What changed:  medication strength when to take this          Time coordinating discharge: 45 min  Signed:  Joseph Art DO  [facility] 06/01/2022, 8:09 AM

## 2022-06-01 NOTE — Progress Notes (Signed)
  Transition of Care (TOC) Screening Note   Patient Details  Name: Benjamin Decker Date of Birth: 1966/01/06   [facility]) CM/SW Contact:    Lavenia Atlas, RN Phone Number: 06/01/2022, 10:15 AM    Transition of Care Department [facility]) has reviewed patient and no TOC needs have been identified at this time. We will continue to monitor patient advancement through interdisciplinary progression rounds. If new patient transition needs arise, please place a TOC consult.

## 2022-07-26 DEATH — deceased

## 2023-08-03 IMAGING — DX DG CHEST 2V
2 series · 2 of 2 positions shown · non-contrast
Comparison: 12/19/2021

CLINICAL DATA: Shortness of breath

EXAM:
CHEST - 2 VIEW

[chest lat]
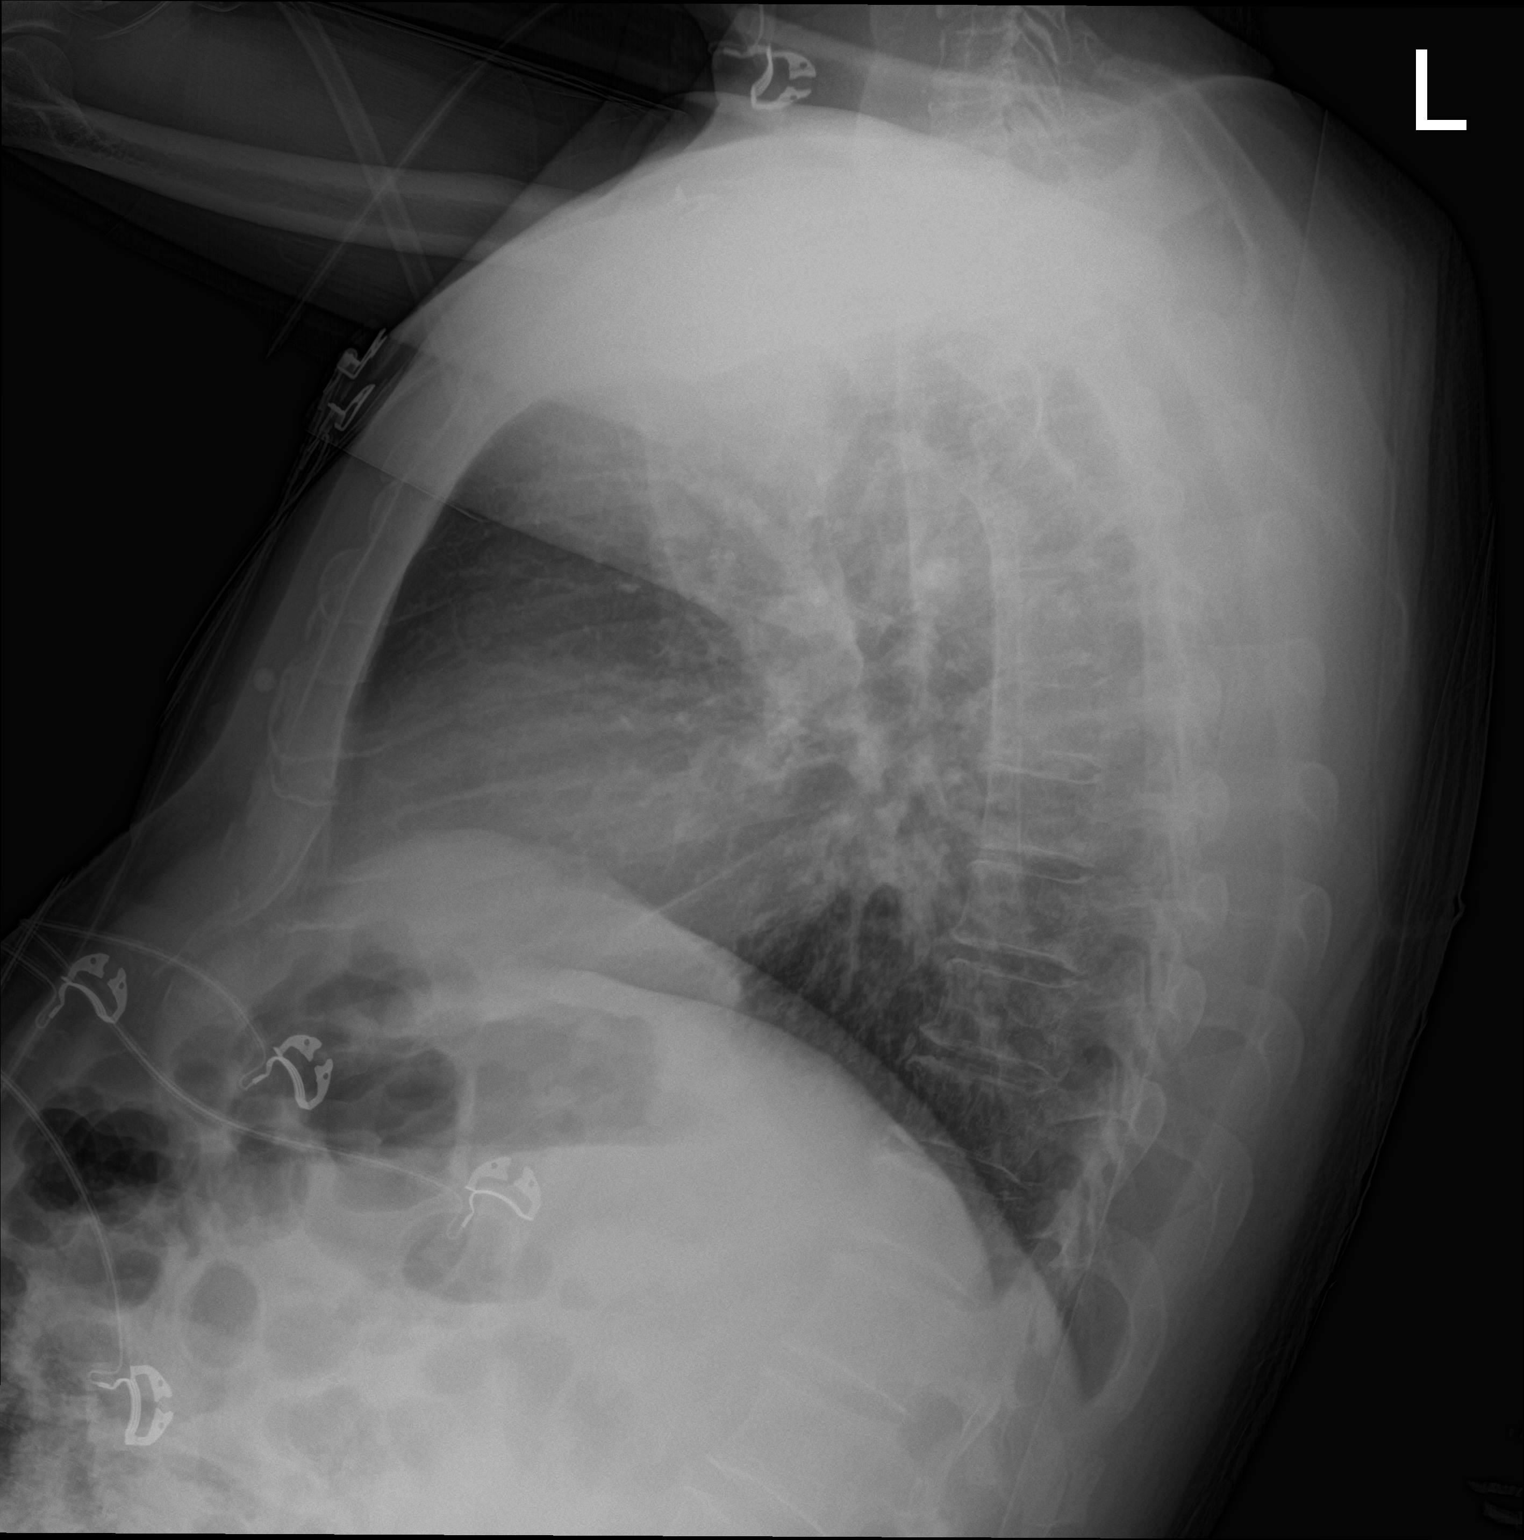

[chest ap strecther]
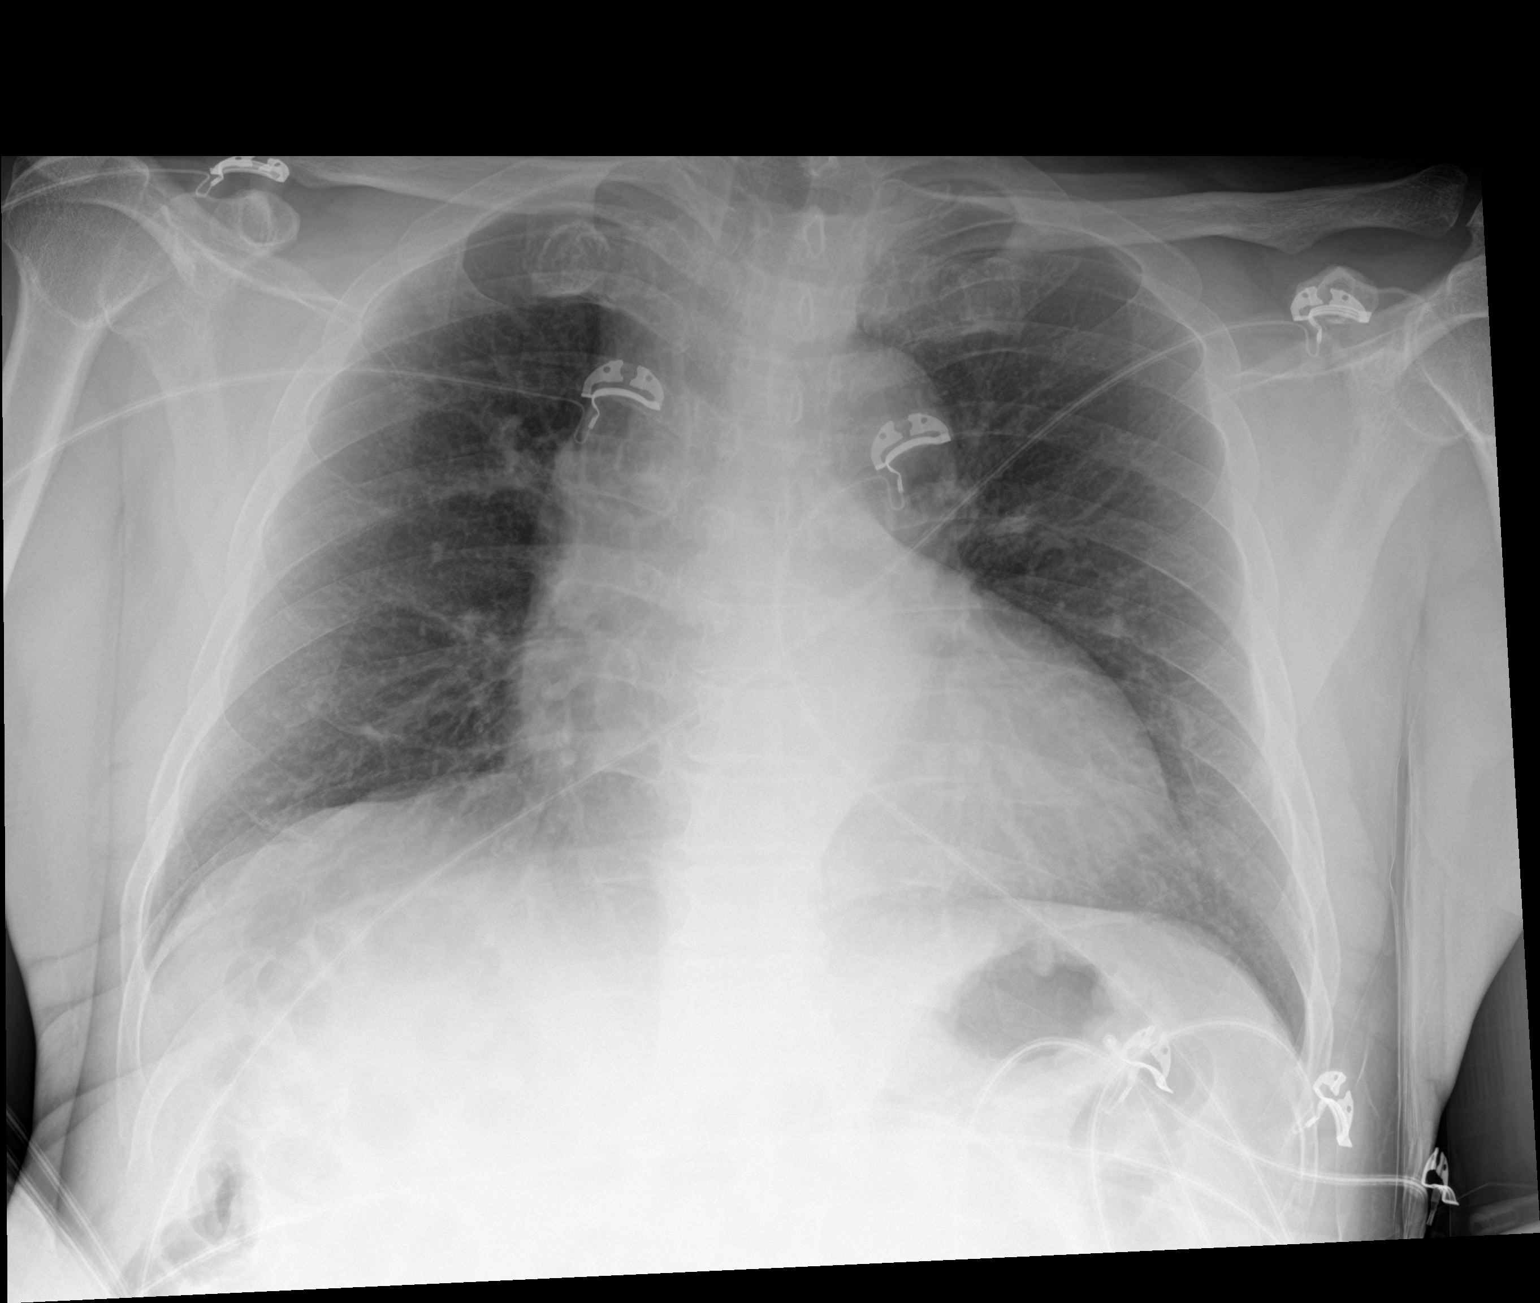

[2 of 2 positions shown; findings below may reference images not displayed]

FINDINGS: Transverse diameter of heart is increased. Thoracic aorta is
tortuous and ectatic. Lung fields are clear of any infiltrates or
pulmonary edema. There is no pleural effusion or pneumothorax.
IMPRESSION: Cardiomegaly. There are no signs of pulmonary edema or focal
pulmonary consolidation.
